# Patient Record
Sex: Male | Born: 1965 | Race: White | Hispanic: No | Marital: Married | Smoking: Never smoker
Health system: Southern US, Community
[De-identification: ages and names within clinical notes are randomized; demographics above are authoritative.]

## PROBLEM LIST (undated history)

## (undated) DIAGNOSIS — I10 Essential (primary) hypertension: Secondary | ICD-10-CM

## (undated) DIAGNOSIS — G4733 Obstructive sleep apnea (adult) (pediatric): Secondary | ICD-10-CM

## (undated) HISTORY — PX: CYST EXCISION PERINEAL: SHX6278

---

## 2002-03-24 ENCOUNTER — Ambulatory Visit (HOSPITAL_COMMUNITY): Admission: RE | Admit: 2002-03-24 | Discharge: 2002-03-24 | Payer: Self-pay | Admitting: General Surgery

## 2007-03-20 ENCOUNTER — Ambulatory Visit (HOSPITAL_COMMUNITY): Admission: RE | Admit: 2007-03-20 | Discharge: 2007-03-20 | Payer: Self-pay | Admitting: *Deleted

## 2007-03-27 ENCOUNTER — Ambulatory Visit (HOSPITAL_COMMUNITY): Admission: RE | Admit: 2007-03-27 | Discharge: 2007-03-27 | Payer: Self-pay | Admitting: *Deleted

## 2011-02-26 NOTE — Cardiovascular Report (Signed)
NAMELEXTON, Michael Hester              ACCOUNT NO.:  [id number]   MEDICAL RECORD NO.:  [id number]          PATIENT TYPE:  OIB   LOCATION:  2852                         FACILITY:  [facility]   PHYSICIAN:  Darlin Priestly, MD  DATE OF BIRTH:  [birth year]   DATE OF PROCEDURE:  03/27/2007  DATE OF DISCHARGE:                            CARDIAC CATHETERIZATION   PROCEDURE:  Heads-up tilt table testing with Isopro infusion.   ATTENDING:  Dr. Lenise Herald.   COMPLICATIONS:  None.   INDICATIONS:  Michael Hester is a 45 year old male patient of Dr. Ilene Qua, Dr. Karleen Hampshire, with a history of hypertension, history  of recurrent syncopal episodes.  He has undergone negative workup today,  including a head CT, echo and Cardiolite scan.  He is now referred for a  heads-up tilt table testing to rule out a possible neurocardiogenic  etiology.   DESCRIPTION OF PROCEDURE:  After being informed of consent, the patient  brought to the cardiac cath lab in fasting state.  He was then placed in  a supine position, and hemodynamic measurements were obtained.  Resting  heart rate 76, the resting blood pressure 134/82.  He was monitored for  approximately 5 minutes.  He was then told to do a 70 degree heads up  position where he was maintained for 30 minutes with no significant  change in heart rate or blood pressure.  He remained asymptomatic.  He  was returned in a supine position.  _______ infusion was begun at 7.5  mm/hour.  Ultimately, I titrated at 30 mm/hour for heart response.  He  was then tilted back to a 70 degree heads up position, where he was  maintained for 10 minutes.  Again, he had no change in his heart rate or  blood pressure.  Remained asymptomatic.  Isopro was then discontinued.  He was returned to a supine position.  He was then transferred to the  recovery room in stable condition.   CONCLUSION:  Negative heads-up tilt table testing with _______ infusion.      Darlin Priestly, MD  Electronically Signed     RHM/MEDQ  D:  03/27/2007  T:  03/27/2007  Job:  [id number]   cc:   Kirk Ruths, M.D.

## 2011-03-01 NOTE — Op Note (Signed)
[facility]  Patient:    Michael Hester, Michael Hester Visit Number: [id number] MRN: [id number]          Service Type: DSU Location: DAY Attending Physician:  Dalia Heading Dictated by:   Franky Macho, M.D. Proc. Date: 03/24/02 Admit Date:  03/24/2002   CC:         Patrica Duel, M.D.   Operative Report  AGE:  45 years old.  PREOPERATIVE DIAGNOSIS:  Pilonidal cyst.  POSTOPERATIVE DIAGNOSIS:  Pilonidal cyst.  PROCEDURE:  Excision of pilonidal cyst.  SURGEON:  Franky Macho, M.D.  ANESTHESIA:  General endotracheal.  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  The risks and benefits of the procedure including bleeding, infection, and recurrence of the pilonidal cyst were fully explained to the patient, who gave informed consent.  DESCRIPTION OF PROCEDURE:  The patient was placed in the right lateral decubitus position after induction of general endotracheal anesthesia.  The pilonidal area was prepped and draped using the usual sterile technique with Betadine.  Confirmation of the intended surgery was confirmed by all present in the operating room.  An elliptical incision was made over the coccyx region, extended approximately 2 to 3 cm.  Hair and granulomatous tissue were noted in the subcutaneous tissue; this was debrided using curettes.  Any bleeding was controlled using Bovie electrocautery.  The wound was irrigated with normal saline.  The skin was reapproximated using 4-0 nylon vertical mattress sutures.  Betadine ointment and dry sterile dressing were applied.  All tape and needle counts were correct at the end of the procedure.  The patient was extubated in the operating room and went back to recovery room awake, in stable condition.  COMPLICATIONS:  None.  SPECIMEN:  Pilonidal cyst.  BLOOD LOSS:  Minimal. Dictated by:   Franky Macho, M.D. Attending  Physician:  Dalia Heading DD:  03/24/02 TD:  03/25/02 Job: [id number]

## 2011-03-10 ENCOUNTER — Emergency Department (HOSPITAL_COMMUNITY)
Admission: EM | Admit: 2011-03-10 | Discharge: 2011-03-10 | Disposition: A | Discharge status: Home / Self Care | Payer: 59 | Attending: Emergency Medicine | Admitting: Emergency Medicine

## 2011-03-10 DIAGNOSIS — W260XXA Contact with knife, initial encounter: Secondary | ICD-10-CM | POA: Insufficient documentation

## 2011-03-10 DIAGNOSIS — W261XXA Contact with sword or dagger, initial encounter: Secondary | ICD-10-CM | POA: Insufficient documentation

## 2011-03-10 DIAGNOSIS — Y998 Other external cause status: Secondary | ICD-10-CM | POA: Insufficient documentation

## 2011-03-10 DIAGNOSIS — S61209A Unspecified open wound of unspecified finger without damage to nail, initial encounter: Secondary | ICD-10-CM | POA: Insufficient documentation

## 2013-07-14 ENCOUNTER — Ambulatory Visit (INDEPENDENT_AMBULATORY_CARE_PROVIDER_SITE_OTHER): Payer: BC Managed Care – PPO | Admitting: Orthopedic Surgery

## 2013-07-14 ENCOUNTER — Encounter: Payer: Self-pay | Admitting: Orthopedic Surgery

## 2013-07-14 ENCOUNTER — Ambulatory Visit (INDEPENDENT_AMBULATORY_CARE_PROVIDER_SITE_OTHER): Payer: BC Managed Care – PPO

## 2013-07-14 VITALS — BP 152/101 | Ht 65.0 in | Wt 220.0 lb

## 2013-07-14 DIAGNOSIS — M7500 Adhesive capsulitis of unspecified shoulder: Secondary | ICD-10-CM

## 2013-07-14 DIAGNOSIS — M25519 Pain in unspecified shoulder: Secondary | ICD-10-CM

## 2013-07-14 DIAGNOSIS — M25511 Pain in right shoulder: Secondary | ICD-10-CM

## 2013-07-14 DIAGNOSIS — M7501 Adhesive capsulitis of right shoulder: Secondary | ICD-10-CM

## 2013-07-14 MED ORDER — HYDROCODONE-ACETAMINOPHEN 5-325 MG PO TABS: 1.0000 | ORAL_TABLET | Freq: Four times a day (QID) | ORAL | Status: DC | PRN

## 2013-07-14 NOTE — Patient Instructions (Addendum)
Therapy   Continue advil and add norco for excess pain   You have received a steroid shot. 15% of patients experience increased pain at the injection site with in the next 24 hours. This is best treated with ice and tylenol extra strength 2 tabs every 8 hours. If you are still having pain please call the office.   Adhesive Capsulitis Sometimes the shoulder becomes stiff and is painful to move. Some people say it feels as if the shoulder is frozen in place. Because of this, the condition is called "frozen shoulder." Its medical name is adhesive capsulitis.  The shoulder joint is made up of strong connective tissue that attaches the ball of the humerus to the shallow shoulder socket. This strong connective tissue is called the joint capsule. This tissue can become stiff and swollen. That is when adhesive capsulitis sets in. CAUSES  It is not always clear just what the cause adhesive capsulitis. Possibilities include:  Injury to the shoulder joint.  Strain. This is a repetitive injury brought about by overuse.  Lack of use. Perhaps your arm or hand was otherwise injured. It might have been in a sling for awhile. Or perhaps you were not using it to avoid pain.  Referred pain. This is a sort of trick the body plays. You feel pain in the shoulder. But, the pain actually comes from an injury somewhere else in the body.  Long-standing health problems. Several diseases can cause adhesive capsulitis. They include diabetes, heart disease, stroke, thyroid problems, rheumatoid arthritis and lung disease.  Being a women older than 40. Anyone can develop adhesive capsulitis but it is most common in women in this age group. SYMPTOMS   Pain.  It occurs when the arm is moved.  Parts of the shoulder might hurt if they are touched.  Pain is worse at night or when resting.  Soreness. It might not be strong enough to be called pain. But, the shoulder aches.  The shoulder does not move freely.  Muscle  spasms.  Trouble sleeping because of shoulder ache or pain. DIAGNOSIS  To decide if you have adhesive capsulitis, your healthcare provider will probably:  Ask about symptoms you have noticed.  Ask about your history of joint pain and anything that might have caused the pain.  Ask about your overall health.  Use hands to feel your shoulder and neck.  Ask you to move your shoulder in specific directions. This may indicate the origin of the pain.  Order imaging tests; pictures of the shoulder. They help pinpoint the source of the problem. An X-ray might be used. For more detail, an MRI is often used. An MRI details the tendons, muscles and ligaments as well as the joint. TREATMENT  Adhesive capsulitis can be treated several ways. Most treatments can be done in a clinic or in your healthcare provider's office. Be sure to discuss the different options with your caregiver. They include:  Physical therapy. You will work on specific exercises to get your shoulder moving again. The exercises usually involve stretching. A physical therapist (a caregiver with special training) can show you what to do and what not to do. The exercises will need to be done daily.  Medication.  Over-the-counter medicines may relieve pain and inflammation (the body's way of reacting to injury or infection).  Corticosteroids. These are stronger drugs to reduce pain and inflammation. They are given by injection (shots) into the shoulder joint. Frequent treatment is not recommended.  Muscle relaxants. Medication may  be prescribed to ease muscle spasms.  Treatment of underlying conditions. This means treating another condition that is causing your shoulder problem. This might be a rotator cuff (tendon) problem  Shoulder manipulation. The shoulder will be moved by your healthcare provider. You would be under general anesthesia (given a drug that puts you to sleep). You would not feel anything. Sometimes the joint will be  injected with salt water (saline) at high pressure to break down internal scarring in the joint capsule.  Surgery. This is rarely needed. It may be suggested in advanced cases after all other treatment has failed. PROGNOSIS  In time, most people recover from adhesive capsulitis. Sometimes, however, the pain goes away but full movement of the shoulder does not return.  HOME CARE INSTRUCTIONS   Take any pain medications recommended by your healthcare provider. Follow the directions carefully.  If you have physical therapy, follow through with the therapist's suggestions. Be sure you understand the exercises you will be doing. You should understand:  How often the exercises should be done.  How many times each exercise should be repeated.  How long they should be done.  What other activities you should do, or not do.  That you should warm up before doing any exercise. Just 5 to 10 minutes will help. Small, gentle movements should get your shoulder ready for more.  Avoid high-demand exercise that involves your shoulder such as throwing. This type of exercise can make pain worse.  Consider using cold packs. Cold may ease swelling and pain. Ask your healthcare provider if a cold pack might help you. If so, get directions on how and when to use them. SEEK MEDICAL CARE IF:   You have any questions about your medications.  Your pain continues to increase. Document Released: 07/28/2009 Document Revised: 12/23/2011 Document Reviewed: 07/28/2009 [facility] Patient Information 2014 [place], [place].

## 2013-07-14 NOTE — Progress Notes (Signed)
Patient ID: Michael Hester, male   DOB: 08-18-1966, 47 y.o.   MRN: [id number] Chief Complaint  Patient presents with  . Shoulder Pain    Right shoulder pain, locks up , can not raise arm., no injury. Consult from Dr. Ladona Ridgel    HISTORY:  47 yo male  presents with atraumatic onset suddenly right shoulder pain stiffness and inability to rotate or elevate his arm With symptoms present for 6 weeks no trauma. Saw chiropractor try manipulation no improvement  . He's on anti-inflammatories but continues to have sharp dull 8/10 intermittent pain which is improved with lying flat on his back. Review of systems negative. 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 Reatha Harps.  Exam BP 152/101  Ht 5\' 5"  (1.651 m)  Wt 220 lb (99.791 kg)  BMI 36.61 kg/m2 His weight is slightly above normal with mild obesity is oriented x3 his appearance is normal mood is normal his gait is normal aircraft he has stiffness and tenderness in the right shoulder with external rotation 5 forward elevation 90. No shoulder instability. Internal and external rotation strength normal. Skin normal. Pulses good sensation normal axillary lymph nodes negative  X-ray negative  He has adhesive capsulitis of the right shoulder  Recommend subacromial injection and physical therapy  Shoulder Injection Procedure Note   Pre-operative Diagnosis: right  RC Syndrome  Post-operative Diagnosis: same  Indications: pain   Anesthesia: ethyl chloride   Procedure Details   Verbal consent was obtained for the procedure. The shoulder was prepped withalcohol and the skin was anesthetized. A 20 gauge needle was advanced into the subacromial space through posterior approach without difficulty  The space was then injected with 3 ml 1%  lidocaine and 1 ml of depomedrol. The injection site was cleansed with isopropyl alcohol and a dressing was applied.  Complications:  None; patient tolerated the procedure well.  Encounter Diagnoses  Name Primary?  . Right shoulder pain   . Capsulitis, adhesive shoulder, right Yes   Meds ordered this encounter  Medications  . HYDROcodone-acetaminophen (NORCO/VICODIN) 5-325 MG per tablet    Sig: Take 1 tablet by mouth every 6 (six) hours as needed for pain.    Dispense:  120 tablet    Refill:  0

## 2013-07-26 ENCOUNTER — Ambulatory Visit (HOSPITAL_COMMUNITY)
Admission: RE | Admit: 2013-07-26 | Discharge: 2013-07-26 | Disposition: A | Discharge status: Home / Self Care | Payer: BC Managed Care – PPO | Source: Ambulatory Visit | Attending: Orthopedic Surgery | Admitting: Orthopedic Surgery

## 2013-07-26 DIAGNOSIS — IMO0001 Reserved for inherently not codable concepts without codable children: Secondary | ICD-10-CM | POA: Insufficient documentation

## 2013-07-26 DIAGNOSIS — M62838 Other muscle spasm: Secondary | ICD-10-CM | POA: Insufficient documentation

## 2013-07-26 DIAGNOSIS — M25511 Pain in right shoulder: Secondary | ICD-10-CM

## 2013-07-26 DIAGNOSIS — M7501 Adhesive capsulitis of right shoulder: Secondary | ICD-10-CM

## 2013-07-26 DIAGNOSIS — M6281 Muscle weakness (generalized): Secondary | ICD-10-CM | POA: Insufficient documentation

## 2013-07-26 DIAGNOSIS — M25519 Pain in unspecified shoulder: Secondary | ICD-10-CM | POA: Insufficient documentation

## 2013-07-26 NOTE — Evaluation (Signed)
Occupational Therapy Evaluation  Patient Details  Name: Michael Hester MRN: [id number] Date of Birth: 19-Oct-1965  Today's Date: 07/26/2013 Time: 1125-1200 OT Time Calculation (min): 35 min OT Evaluation 7829-5621 10' Manual Therapy 1135-1150 15' Visit#: 1 of 16  Re-eval: 08/23/13  Assessment Diagnosis: Right Shoulder Adhesive Capsulitis Next MD Visit: 6 months with Dr. Romeo Apple Prior Therapy: n/a  Authorization: n/a  Authorization Time Period:    Authorization Visit#:   of     Past Medical History: No past medical history on file. Past Surgical History: No past surgical history on file.  Subjective  S:  I want to be able to golf again and use my right arm like I do my left.  Pertinent History: Mr. Nickerson began experiencing pain in his right shoulder with bed mobility and difficulty sleeping at night in April of this year.  By July, he was limiited in his movement.  He consulted with Dr. Romeo Apple and recieved a cortisone injection, which has alleviated his pain and has not improved his mobility.  He has been referred to occupational therapy for evaluation and treatment.   Limitations: progress as tolerated Special Tests: FOTO scored 51/100. Patient Stated Goals: "I want to be able to golf and use my right arm like I do my left." Pain Assessment Currently in Pain?: Yes Pain Score: 3  Pain Location: Shoulder Pain Orientation: Right Pain Type: Acute pain  Precautions/Restrictions  Precautions Precautions: None Restrictions Weight Bearing Restrictions: No  Balance Screening Balance Screen Has the patient fallen in the past 6 months: No Has the patient had a decrease in activity level because of a fear of falling? : No Is the patient reluctant to leave their home because of a fear of falling? : No  Prior Functioning  Prior Function Driving: Yes Vocation: Full time employment Vocation Requirements: Charity fundraiser, works at a low table, reaches overhead frequently Comments:  enjoys golfing and is active with the boyscouts.  He lives with his wife  Assessment ADL/Vision/Perception ADL ADL Comments: difficulty reaching overhead, laying on his right side, getting in and out of bed, is not able to golf or lift heavy items Dominant Hand: Right Vision - History Baseline Vision: Wears glasses all the time  Cognition/Observation Cognition Overall Cognitive Status: Within Functional Limits for tasks assessed  Sensation/Coordination/Edema Sensation Light Touch: Appears Intact Coordination Gross Motor Movements are Fluid and Coordinated: Yes Fine Motor Movements are Fluid and Coordinated: Yes  Additional Assessments RUE AROM (degrees) RUE Overall AROM Comments: assessed in seated, ER/IR with shoulder adducted Right Shoulder Flexion: 90 Degrees Right Shoulder ABduction: 50 Degrees Right Shoulder Internal Rotation: 70 Degrees Right Shoulder External Rotation: 64 Degrees RUE PROM (degrees) Right Shoulder Flexion: 100 Degrees Right Shoulder ABduction: 65 Degrees Right Shoulder Internal Rotation: 70 Degrees Right Shoulder External Rotation: 64 Degrees RUE Strength Right Shoulder Flexion: 3+/5 Right Shoulder ABduction: 3+/5 Right Shoulder Internal Rotation: 3+/5 Right Shoulder External Rotation: 3+/5 LUE AROM (degrees) LUE Overall AROM Comments: assessed in seated, PROM and strength is [facility] Left Shoulder Flexion: 140 Degrees Left Shoulder ABduction: 140 Degrees Left Shoulder Internal Rotation: 90 Degrees Left Shoulder External Rotation: 74 Degrees Palpation Palpation: max fascial restrictions noted in his right upper arm, shoulder, and scapular region to decrease pain and fascial restrictions     Exercise/Treatments  Manual Therapy Manual Therapy: Myofascial release Myofascial Release: MFR and manual stretching to decrease pain and fascial restrictions and improve pain free mobility in right scapular, shoulder, trapezius, and upper arm region.  Occupational Therapy Assessment and Plan OT Assessment and Plan Clinical Impression Statement: A:  Patient presents with decreased A/PROM and strength due to increased pain and fascial restrictions in his right shoulder, causing decreased independence with all B/IADLs, work, and leisure activities.   Pt will benefit from skilled therapeutic intervention in order to improve on the following deficits: Decreased strength;Increased fascial restricitons;Increased muscle spasms;Decreased range of motion;Pain Rehab Potential: Good OT Frequency: Min 2X/week OT Duration: 8 weeks OT Treatment/Interventions: Self-care/ADL training;Therapeutic exercise;Manual therapy;Modalities;Patient/family education;Therapeutic activities OT Plan: P:  Skilled OT intervention to decrease pain and fascial restrictions and improve pain free mobility and strength needed to return to prior level of independence with all B/IADLS, work, and leisure activities.  Treatment Plan:  MFR and manual stretching in supine.  Supine PROM, AAROM, isometric strengthening.  seated elev, ext, row, ball stretches, thumbtacks, prot/ret//elev/dep.     Goals Short Term Goals Time to Complete Short Term Goals: 4 weeks Short Term Goal 1: Patient will be independent with HEP. Short Term Goal 2: Patient will improve PROM in his right shoulder to [facility] for increased ability to reach shelving at work.  Short Term Goal 3: Patient will increase right shoulder strength to 4/5 for increased abilty to lift items overhead at work.  Short Term Goal 4: Patient will decrease pain in his right shoulder to 2/10 when rolling over in bed. Short Term Goal 5: Patient will decrease fascial restrictions to min-mod in his right shoulder region. Long Term Goals Time to Complete Long Term Goals: 8 weeks Long Term Goal 1: Patient will return to prior level of independence with all B/IADLS, work, and leisure activities, using his right arm as dominant.  Long Term Goal 2:  Patient will improve AROM in his right shoulder to [facility] for increased ability to reach shelving at work.  Long Term Goal 3: Patient will increase right shoulder strength to 5/5 for increased abilty to lift items overhead at work.  Long Term Goal 4: Patient will decrease pain in his right shoulder to 1/10 when rolling over in bed. Long Term Goal 5: Patient will decrease fascial restrictions to minimal in his right shoulder region.  Problem List Patient Active Problem List   Diagnosis Date Noted  . Pain in joint, shoulder region 07/26/2013  . Muscle weakness (generalized) 07/26/2013  . Capsulitis, adhesive shoulder 07/14/2013    End of Session Activity Tolerance: Patient tolerated treatment well General Behavior During Therapy: [facility] for tasks assessed/performed OT Plan of Care OT Home Exercise Plan: towel slides - educated on and paper given. Consulted and Agree with Plan of Care: Patient  GO    Shirlean Mylar, OTR/L  07/26/2013, 12:22 PM  Physician Documentation Your signature is required to indicate approval of the treatment plan as stated above.  Please sign and either send electronically or make a copy of this report for your files and return this physician signed original.  Please mark one 1.__approve of plan  2. ___approve of plan with the following conditions.   ______________________________                                                          _____________________ Physician Signature  Date  

## 2013-07-28 ENCOUNTER — Ambulatory Visit (HOSPITAL_COMMUNITY)
Admission: RE | Admit: 2013-07-28 | Discharge: 2013-07-28 | Disposition: A | Discharge status: Home / Self Care | Payer: BC Managed Care – PPO | Source: Ambulatory Visit | Attending: Orthopedic Surgery | Admitting: Orthopedic Surgery

## 2013-07-28 DIAGNOSIS — M6281 Muscle weakness (generalized): Secondary | ICD-10-CM

## 2013-07-28 DIAGNOSIS — M25511 Pain in right shoulder: Secondary | ICD-10-CM

## 2013-07-28 NOTE — Progress Notes (Signed)
Occupational Therapy Treatment Patient Details  Name: Michael Hester MRN: [id number] Date of Birth: [birth year]  Today's Date: 07/28/2013 Time: 1020-1110 OT Time Calculation (min): 50 min Manual therapy 1020-1045 25' Therapeutic exercises 1045-1110 25' Visit#: 2 of 16  Re-eval: 08/23/13    Authorization: n/a  Authorization Time Period:    Authorization Visit#:   of    Subjective  S:  I was able to move my arm through the full range on Tuesday and today its flared up and inflamed again.  Pain Assessment Currently in Pain?: Yes Pain Score: 2  Pain Location: Shoulder Pain Orientation: Right Pain Type: Acute pain  Precautions/Restrictions     Exercise/Treatments Supine Protraction: PROM;AAROM;10 reps Horizontal ABduction: PROM;AAROM;10 reps External Rotation: PROM;AAROM;10 reps Internal Rotation: PROM;AAROM;10 reps Flexion: PROM;AAROM;10 reps ABduction: PROM;AAROM;10 reps Therapy Ball Flexion: 25 reps ABduction: 25 reps Right/Left: 5 reps      Manual Therapy Manual Therapy: Myofascial release Myofascial Release: MFR and manual stretching to decrease pain and fascial restrictions and improve pain free mobility in right scapular, shoulder, trapezius, and upper arm region.  Occupational Therapy Assessment and Plan OT Assessment and Plan Clinical Impression Statement: A:  Patient quite guarded with PROM/manual stretching due to elevated pain at end range of stretch.  Patient able to self stretch further with dowel rod, therefore added AAROM exercises during treatment session and added for HEP.  OT Plan: P:  Add pulleys and wall wash for gaining AAROM.   Goals Short Term Goals Time to Complete Short Term Goals: 4 weeks Short Term Goal 1: Patient will be independent with HEP. Short Term Goal 2: Patient will improve PROM in his right shoulder to [facility] for increased ability to reach shelving at work.  Short Term Goal 3: Patient will increase right shoulder strength to 4/5  for increased abilty to lift items overhead at work.  Short Term Goal 4: Patient will decrease pain in his right shoulder to 2/10 when rolling over in bed. Short Term Goal 5: Patient will decrease fascial restrictions to min-mod in his right shoulder region. Long Term Goals Time to Complete Long Term Goals: 8 weeks Long Term Goal 1: Patient will return to prior level of independence with all B/IADLS, work, and leisure activities, using his right arm as dominant.  Long Term Goal 2: Patient will improve AROM in his right shoulder to [facility] for increased ability to reach shelving at work.  Long Term Goal 3: Patient will increase right shoulder strength to 5/5 for increased abilty to lift items overhead at work.  Long Term Goal 4: Patient will decrease pain in his right shoulder to 1/10 when rolling over in bed. Long Term Goal 5: Patient will decrease fascial restrictions to minimal in his right shoulder region.  Problem List Patient Active Problem List   Diagnosis Date Noted  . Pain in joint, shoulder region 07/26/2013  . Muscle weakness (generalized) 07/26/2013  . Capsulitis, adhesive shoulder 07/14/2013    End of Session Activity Tolerance: Patient tolerated treatment well General Behavior During Therapy: WFL for tasks assessed/performed OT Plan of Care OT Home Exercise Plan: added dowel rod in supine for flexion, abduction, external rotation, and horizontal abduction 10X each 2 X per day  GO    Shirlean Mylar, OTR/L  07/28/2013, 11:04 AM

## 2013-08-03 ENCOUNTER — Ambulatory Visit (HOSPITAL_COMMUNITY): Payer: BC Managed Care – PPO | Admitting: Occupational Therapy

## 2013-08-05 ENCOUNTER — Ambulatory Visit (HOSPITAL_COMMUNITY)
Admission: RE | Admit: 2013-08-05 | Discharge: 2013-08-05 | Disposition: A | Discharge status: Home / Self Care | Payer: BC Managed Care – PPO | Source: Ambulatory Visit | Attending: Occupational Therapy | Admitting: Occupational Therapy

## 2013-08-05 NOTE — Progress Notes (Signed)
Occupational Therapy Treatment Patient Details  Name: Michael Hester MRN: [id number] Date of Birth: 12-Dec-1965  Today's Date: 08/05/2013 Time: 4098-1191 OT Time Calculation (min): 47 min Manual 1521-1541 (20') TherExercise 4782-9562 (27')  Visit#: 3 of 16  Re-eval: 08/23/13     Subjective Symptoms/Limitations Symptoms: S: I have a ball at home and I have been doing the exercises that she showed me the other day  Pain Assessment Currently in Pain?: Yes Pain Score: 2  Pain Location: Shoulder Pain Orientation: Right Pain Type: Acute pain Multiple Pain Sites: No      Exercise/Treatments Supine Protraction: PROM;AAROM;10 reps Horizontal ABduction: PROM;AAROM;10 reps External Rotation: PROM;AAROM;10 reps Internal Rotation: PROM;AAROM;10 reps Flexion: PROM;AAROM;10 reps ABduction: PROM;AAROM;10 reps Seated Elevation: AROM;10 reps Extension: AROM;10 reps Row: AROM;10 reps Pulleys Flexion: 1 minute ABduction: 1 minute Therapy Ball Flexion: 25 reps ABduction: 25 reps Right/Left: 5 reps         Occupational Therapy Assessment and Plan OT Assessment and Plan Clinical Impression Statement: A: Patient cont with guarded posturing with PROM/manual stretching.  Added pulleys this date wtih fair tolerance/success.  Patient with increased compensatory trunk rotation to achieve range.  Educated on decreasing compensation and improving body awareness.  OT Plan: P: Add wall wash for gaining AAROM.     Goals Short Term Goals Short Term Goal 1: Patient will be independent with HEP. Short Term Goal 1 Progress: Progressing toward goal Short Term Goal 2: Patient will improve PROM in his right shoulder to [facility] for increased ability to reach shelving at work.  Short Term Goal 2 Progress: Progressing toward goal Short Term Goal 3: Patient will increase right shoulder strength to 4/5 for increased abilty to lift items overhead at work.  Short Term Goal 3 Progress: Progressing  toward goal Short Term Goal 4: Patient will decrease pain in his right shoulder to 2/10 when rolling over in bed. Short Term Goal 4 Progress: Progressing toward goal Short Term Goal 5: Patient will decrease fascial restrictions to min-mod in his right shoulder region. Short Term Goal 5 Progress: Progressing toward goal Long Term Goals Long Term Goal 1: Patient will return to prior level of independence with all B/IADLS, work, and leisure activities, using his right arm as dominant.  Long Term Goal 1 Progress: Progressing toward goal Long Term Goal 2: Patient will improve AROM in his right shoulder to [facility] for increased ability to reach shelving at work.  Long Term Goal 2 Progress: Progressing toward goal Long Term Goal 3: Patient will increase right shoulder strength to 5/5 for increased abilty to lift items overhead at work.  Long Term Goal 3 Progress: Progressing toward goal Long Term Goal 4: Patient will decrease pain in his right shoulder to 1/10 when rolling over in bed. Long Term Goal 4 Progress: Progressing toward goal Long Term Goal 5: Patient will decrease fascial restrictions to minimal in his right shoulder region. Long Term Goal 5 Progress: Progressing toward goal  Problem List Patient Active Problem List   Diagnosis Date Noted  . Pain in joint, shoulder region 07/26/2013  . Muscle weakness (generalized) 07/26/2013  . Capsulitis, adhesive shoulder 07/14/2013    End of Session Activity Tolerance: Patient tolerated treatment well  GO    Velora Mediate, OTR/L  08/05/2013, 4:38 PM

## 2013-08-10 ENCOUNTER — Ambulatory Visit (HOSPITAL_COMMUNITY)
Admission: RE | Admit: 2013-08-10 | Discharge: 2013-08-10 | Disposition: A | Discharge status: Home / Self Care | Payer: BC Managed Care – PPO | Source: Ambulatory Visit | Attending: Family Medicine | Admitting: Family Medicine

## 2013-08-10 DIAGNOSIS — M25511 Pain in right shoulder: Secondary | ICD-10-CM

## 2013-08-10 DIAGNOSIS — M6281 Muscle weakness (generalized): Secondary | ICD-10-CM

## 2013-08-10 NOTE — Progress Notes (Signed)
Occupational Therapy Treatment Patient Details  Name: Michael Hester MRN: [id number] Date of Birth: 07-19-1966  Today's Date: 08/10/2013 Time: 1100-1149 OT Time Calculation (min): 49 min Manual Therapy 1100-1120 20' Therapeutic exercises 1120-1149 29' Visit#: 4 of 16  Re-eval: 08/23/13    Subjective S:  I think its better, the inflammation leaves quicker and doesnt seem as intense.  Pain Assessment Currently in Pain?: Yes Pain Score: 2  Pain Location: Shoulder Pain Orientation: Right Pain Type: Acute pain  Precautions/Restrictions   progress as tolerated  Exercise/Treatments Supine Protraction: PROM;10 reps;AAROM;12 reps Horizontal ABduction: PROM;10 reps;AAROM;12 reps External Rotation: PROM;10 reps;AAROM;12 reps Internal Rotation: PROM;10 reps;AAROM;12 reps Flexion: PROM;10 reps;AAROM;12 reps ABduction: PROM;10 reps;AAROM;12 reps Standing Extension: Theraband;15 reps Theraband Level (Shoulder Extension): Level 2 (Red) Row: Theraband;15 reps Theraband Level (Shoulder Row): Level 2 (Red) Pulleys Flexion: 2 minutes ABduction: 2 minutes Therapy Ball Flexion: 25 reps ABduction: 25 reps Right/Left: 5 reps ROM / Strengthening / Isometric Strengthening Wall Wash: 2' Proximal Shoulder Strengthening, Supine: 10 times each without resting      Manual Therapy Manual Therapy: Myofascial release Myofascial Release: MFR and manual stretching to decrease pain and fascial restrictions and improve pain free mobility in right scapular, shoulder, trapezius, and upper arm region.  Occupational Therapy Assessment and Plan OT Assessment and Plan Clinical Impression Statement: A:  Patient with significant improvement in mobility in supine this date.  Patient aware of compensatory movements and self corrected throughout treatment session.  OT Plan: P:  Add thumbtacks and prot/ret//elev/dep for greater shoulder stability   Goals Short Term Goals Short Term Goal 1: Patient will  be independent with HEP. Short Term Goal 1 Progress: Progressing toward goal Short Term Goal 2: Patient will improve PROM in his right shoulder to [facility] for increased ability to reach shelving at work.  Short Term Goal 2 Progress: Progressing toward goal Short Term Goal 3: Patient will increase right shoulder strength to 4/5 for increased abilty to lift items overhead at work.  Short Term Goal 3 Progress: Progressing toward goal Short Term Goal 4: Patient will decrease pain in his right shoulder to 2/10 when rolling over in bed. Short Term Goal 4 Progress: Progressing toward goal Short Term Goal 5: Patient will decrease fascial restrictions to min-mod in his right shoulder region. Short Term Goal 5 Progress: Progressing toward goal Long Term Goals Long Term Goal 1: Patient will return to prior level of independence with all B/IADLS, work, and leisure activities, using his right arm as dominant.  Long Term Goal 1 Progress: Progressing toward goal Long Term Goal 2: Patient will improve AROM in his right shoulder to [facility] for increased ability to reach shelving at work.  Long Term Goal 2 Progress: Progressing toward goal Long Term Goal 3: Patient will increase right shoulder strength to 5/5 for increased abilty to lift items overhead at work.  Long Term Goal 3 Progress: Progressing toward goal Long Term Goal 4: Patient will decrease pain in his right shoulder to 1/10 when rolling over in bed. Long Term Goal 4 Progress: Progressing toward goal Long Term Goal 5: Patient will decrease fascial restrictions to minimal in his right shoulder region. Long Term Goal 5 Progress: Progressing toward goal  Problem List Patient Active Problem List   Diagnosis Date Noted  . Pain in joint, shoulder region 07/26/2013  . Muscle weakness (generalized) 07/26/2013  . Capsulitis, adhesive shoulder 07/14/2013    End of Session Activity Tolerance: Patient tolerated treatment well General Behavior During Therapy:  [facility]  for tasks assessed/performed  GO    Shirlean Mylar, OTR/L  08/10/2013, 11:51 AM

## 2013-08-13 ENCOUNTER — Ambulatory Visit (HOSPITAL_COMMUNITY)
Admission: RE | Admit: 2013-08-13 | Discharge: 2013-08-13 | Disposition: A | Discharge status: Home / Self Care | Payer: BC Managed Care – PPO | Source: Ambulatory Visit | Attending: Family Medicine | Admitting: Family Medicine

## 2013-08-13 DIAGNOSIS — M6281 Muscle weakness (generalized): Secondary | ICD-10-CM

## 2013-08-13 DIAGNOSIS — M25511 Pain in right shoulder: Secondary | ICD-10-CM

## 2013-08-13 NOTE — Progress Notes (Signed)
Occupational Therapy Treatment Patient Details  Name: KEPLER MCCABE MRN: [id number] Date of Birth: May 16, 1966  Today's Date: 08/13/2013 Time: 4098-1191 OT Time Calculation (min): 42 min Manual Therapy [id number] 28' Therapeutic exercises [phone]' Visit#: 5 of 16  Re-eval: 08/23/13    Authorization: n/a  Authorization Time Period:    Authorization Visit#:   of    Subjective S:  I can reach the rearview mirror of my Michael Hester now.  Limitations: progress as tolerated Pain Assessment Currently in Pain?: Yes Pain Score: 1  Pain Location: Shoulder Pain Orientation: Right Pain Type: Acute pain  Precautions/Restrictions   progress as tolerated  Exercise/Treatments Supine Protraction: PROM;10 reps;AROM;AAROM;5 reps Horizontal ABduction: PROM;10 reps;AROM;AAROM;5 reps External Rotation: PROM;10 reps;AROM;AAROM;5 reps Internal Rotation: PROM;10 reps;AROM;AAROM Flexion: PROM;10 reps;AAROM;AROM;5 reps ABduction: AROM;AAROM;5 reps;PROM;10 reps Seated External Rotation: AROM;5 reps Internal Rotation: AROM;5 reps Standing External Rotation: Theraband;15 reps Theraband Level (Shoulder External Rotation): Level 2 (Red) Internal Rotation: Theraband;15 reps Theraband Level (Shoulder Internal Rotation): Level 2 (Red) Extension: Theraband;15 reps Theraband Level (Shoulder Extension): Level 2 (Red) Row: Theraband;15 reps Theraband Level (Shoulder Row): Level 2 (Red) Pulleys Flexion: 2 minutes ABduction: 2 minutes ROM / Strengthening / Isometric Strengthening Wall Wash: resume next visit Proximal Shoulder Strengthening, Supine: resume next visit         Manual Therapy Manual Therapy: Myofascial release Myofascial Release: MFR and manual stretching to decrease pain and fascial restrictions and improve pain free mobility in right scapular, shoulder, trapezius, and upper arm region.  Occupational Therapy Assessment and Plan OT Assessment and Plan Clinical Impression Statement: A:   Added AROM in addition to AAROM in supine.  Transitioned to ER with shoulder abducted to 90 for ER stretching with great results. OT Plan: P:  Add thumbtacks and prot/ret//elev/dep, add seated dowel rod exercises.   Goals Short Term Goals Short Term Goal 1: Patient will be independent with HEP. Short Term Goal 1 Progress: Progressing toward goal Short Term Goal 2: Patient will improve PROM in his right shoulder to [facility] for increased ability to reach shelving at work.  Short Term Goal 2 Progress: Progressing toward goal Short Term Goal 3: Patient will increase right shoulder strength to 4/5 for increased abilty to lift items overhead at work.  Short Term Goal 3 Progress: Progressing toward goal Short Term Goal 4: Patient will decrease pain in his right shoulder to 2/10 when rolling over in bed. Short Term Goal 4 Progress: Progressing toward goal Short Term Goal 5: Patient will decrease fascial restrictions to min-mod in his right shoulder region. Short Term Goal 5 Progress: Progressing toward goal Long Term Goals Long Term Goal 1: Patient will return to prior level of independence with all B/IADLS, work, and leisure activities, using his right arm as dominant.  Long Term Goal 1 Progress: Progressing toward goal Long Term Goal 2: Patient will improve AROM in his right shoulder to [facility] for increased ability to reach shelving at work.  Long Term Goal 2 Progress: Progressing toward goal Long Term Goal 3: Patient will increase right shoulder strength to 5/5 for increased abilty to lift items overhead at work.  Long Term Goal 3 Progress: Progressing toward goal Long Term Goal 4: Patient will decrease pain in his right shoulder to 1/10 when rolling over in bed. Long Term Goal 4 Progress: Progressing toward goal Long Term Goal 5: Patient will decrease fascial restrictions to minimal in his right shoulder region. Long Term Goal 5 Progress: Progressing toward goal  Problem List Patient Active Problem  List   Diagnosis Date Noted  . Pain in joint, shoulder region 07/26/2013  . Muscle weakness (generalized) 07/26/2013  . Capsulitis, adhesive shoulder 07/14/2013    End of Session Activity Tolerance: Patient tolerated treatment well General Behavior During Therapy: [facility] for tasks assessed/performed OT Plan of Care OT Home Exercise Plan: pulley   Shirlean Mylar, OTR/L  08/13/2013, 1:11 PM

## 2013-08-17 ENCOUNTER — Ambulatory Visit (HOSPITAL_COMMUNITY)
Admission: RE | Admit: 2013-08-17 | Discharge: 2013-08-17 | Disposition: A | Discharge status: Home / Self Care | Payer: BC Managed Care – PPO | Source: Ambulatory Visit | Attending: Family Medicine | Admitting: Family Medicine

## 2013-08-17 DIAGNOSIS — IMO0001 Reserved for inherently not codable concepts without codable children: Secondary | ICD-10-CM | POA: Insufficient documentation

## 2013-08-17 DIAGNOSIS — M62838 Other muscle spasm: Secondary | ICD-10-CM | POA: Insufficient documentation

## 2013-08-17 DIAGNOSIS — M6281 Muscle weakness (generalized): Secondary | ICD-10-CM | POA: Insufficient documentation

## 2013-08-17 DIAGNOSIS — M25519 Pain in unspecified shoulder: Secondary | ICD-10-CM | POA: Insufficient documentation

## 2013-08-17 NOTE — Progress Notes (Signed)
Occupational Therapy Treatment Patient Details  Name: Michael Hester MRN: [id number] Date of Birth: 09-11-1966  Today's Date: 08/17/2013 Time: 4098-1191 OT Time Calculation (min): 47 min Manual 1350-1410 )20') TherExercises 1410-1437 (27')   Visit#: 6 of 16  Re-eval:      Authorization: n/a  Authorization Time Period:    Authorization Visit#:   of    Subjective Symptoms/Limitations Symptoms: S:  I really feel like we did too much last time... really overdid it trying to get that last little bit on the pulleys Pain Assessment Currently in Pain?: Yes Pain Score: 5  Pain Location: Shoulder Pain Orientation: Right Pain Type: Acute pain Multiple Pain Sites: No  Precautions/Restrictions     Exercise/Treatments Supine Protraction: PROM;10 reps;AROM;AAROM;5 reps Horizontal ABduction: PROM;10 reps;AROM;AAROM;5 reps External Rotation: PROM;10 reps;AROM;AAROM;5 reps Internal Rotation: PROM;10 reps;AROM;AAROM Flexion: PROM;10 reps;AAROM;AROM;5 reps ABduction: AROM;AAROM;5 reps;PROM;10 reps Seated External Rotation: AROM;10 reps Internal Rotation: AROM;10 reps Standing External Rotation: Theraband;15 reps Theraband Level (Shoulder External Rotation): Level 2 (Red) Internal Rotation: Theraband;15 reps Theraband Level (Shoulder Internal Rotation): Level 2 (Red) Extension: Theraband;15 reps Theraband Level (Shoulder Extension): Level 2 (Red) Row: Theraband;15 reps Theraband Level (Shoulder Row): Level 2 (Red) Pulleys Flexion: 2 minutes ABduction: 2 minutes ROM / Strengthening / Isometric Strengthening UBE (Upper Arm Bike): 2' (handles on 4 and increased height on base for increased ROM) Wall Wash: 2' Proximal Shoulder Strengthening, Supine: resume next visit    Manual Therapy Manual Therapy: Myofascial release Myofascial Release: MFR and manual stretching to decrease pain and fascial restrictions and improve pain free mobility in right scapular, shoulder, trapezius,  and upper arm region  Occupational Therapy Assessment and Plan OT Assessment and Plan Clinical Impression Statement: A:  Patient with increased complaint of tinghtness  this date and report of overdoing things. Initiated UBE this date with body of machine elevated to increase range of motion with good tolerance  OT Plan: P:  Add thumbtacks and prot/ret//elev/dep, add seated dowel rod exercises.   Goals Short Term Goals Short Term Goal 1: Patient will be independent with HEP. Short Term Goal 1 Progress: Progressing toward goal Short Term Goal 2: Patient will improve PROM in his right shoulder to [facility] for increased ability to reach shelving at work.  Short Term Goal 2 Progress: Progressing toward goal Short Term Goal 3: Patient will increase right shoulder strength to 4/5 for increased abilty to lift items overhead at work.  Short Term Goal 3 Progress: Progressing toward goal Short Term Goal 4: Patient will decrease pain in his right shoulder to 2/10 when rolling over in bed. Short Term Goal 4 Progress: Progressing toward goal Short Term Goal 5: Patient will decrease fascial restrictions to min-mod in his right shoulder region. Short Term Goal 5 Progress: Progressing toward goal Long Term Goals Long Term Goal 1: Patient will return to prior level of independence with all B/IADLS, work, and leisure activities, using his right arm as dominant.  Long Term Goal 1 Progress: Progressing toward goal Long Term Goal 2: Patient will improve AROM in his right shoulder to [facility] for increased ability to reach shelving at work.  Long Term Goal 2 Progress: Progressing toward goal Long Term Goal 3: Patient will increase right shoulder strength to 5/5 for increased abilty to lift items overhead at work.  Long Term Goal 3 Progress: Progressing toward goal Long Term Goal 4: Patient will decrease pain in his right shoulder to 1/10 when rolling over in bed. Long Term Goal 4 Progress: Progressing toward goal  Long  Term Goal 5: Patient will decrease fascial restrictions to minimal in his right shoulder region. Long Term Goal 5 Progress: Progressing toward goal  Problem List Patient Active Problem List   Diagnosis Date Noted  . Pain in joint, shoulder region 07/26/2013  . Muscle weakness (generalized) 07/26/2013  . Capsulitis, adhesive shoulder 07/14/2013    End of Session Activity Tolerance: Patient tolerated treatment well General Behavior During Therapy: [facility] for tasks assessed/performed   Velora Mediate, OTR/L  08/17/2013, 4:00 PM

## 2013-08-20 ENCOUNTER — Ambulatory Visit (HOSPITAL_COMMUNITY)
Admission: RE | Admit: 2013-08-20 | Discharge: 2013-08-20 | Disposition: A | Discharge status: Home / Self Care | Payer: BC Managed Care – PPO | Source: Ambulatory Visit | Attending: Family Medicine | Admitting: Family Medicine

## 2013-08-23 NOTE — Progress Notes (Signed)
Occupational Therapy Treatment Patient Details  Name: Michael Hester MRN: [id number] Date of Birth: Apr 20, 1966  Today's Date: 08/20/2013 Time: 1308-6578 OT Time Calculation (min): 47 min Manual 1518-1538 (20') TherExercises 4696-2952 (27')  Visit#: 7 of 16  Re-eval: 08/23/13    Authorization: n/a   Subjective Symptoms/Limitations Symptoms: S: I am a little tight up where my knot is, up in the soft tissue Pain Assessment Currently in Pain?: Yes Pain Score: 3  Pain Location: Shoulder Pain Orientation: Right Pain Type: Acute pain Multiple Pain Sites: No   Exercise/Treatments Supine Protraction: PROM;10 reps;AROM;AAROM;5 reps Horizontal ABduction: PROM;10 reps;AROM;AAROM;5 reps External Rotation: PROM;10 reps;AROM;AAROM;5 reps Internal Rotation: PROM;10 reps;AROM;AAROM Flexion: PROM;10 reps;AAROM;AROM;5 reps ABduction: PROM;10 reps;AROM;AAROM;5 reps Seated External Rotation: AROM;10 reps Internal Rotation: AROM;10 reps Standing Other Standing Exercises: standing towel stretches for IR/ER x 5 reps each with 10-15 second hold  Pulleys Flexion: 2 minutes ABduction: 2 minutes Stretches Corner Stretch: 5 reps;10 seconds Other Shoulder Stretches: over head  flexion with 5# weight in bilateral hands x 1' Other Shoulder Stretches: also instructedon doorway stretches x 5 reps x 10 seconds  Occupational Therapy Assessment and Plan OT Assessment and Plan Clinical Impression Statement: A: Patient with complaint of shoulder continuing to tighten up throughout day.  Educated on, performed and verblaized good understanding of corner/doorway stretches this date.  towel stretches performed with no complaint of increased pain only stiffness and tight shoulder for IR.  OT Plan: P:  Add thumbtacks and prot/ret//elev/dep, add seated dowel rod exercises.   Goals Short Term Goals Short Term Goal 1: Patient will be independent with HEP. Short Term Goal 1 Progress: Progressing toward  goal Short Term Goal 2: Patient will improve PROM in his right shoulder to [facility] for increased ability to reach shelving at work.  Short Term Goal 2 Progress: Progressing toward goal Short Term Goal 3: Patient will increase right shoulder strength to 4/5 for increased abilty to lift items overhead at work.  Short Term Goal 3 Progress: Progressing toward goal Short Term Goal 4: Patient will decrease pain in his right shoulder to 2/10 when rolling over in bed. Short Term Goal 4 Progress: Progressing toward goal Short Term Goal 5: Patient will decrease fascial restrictions to min-mod in his right shoulder region. Short Term Goal 5 Progress: Progressing toward goal Long Term Goals Long Term Goal 1: Patient will return to prior level of independence with all B/IADLS, work, and leisure activities, using his right arm as dominant.  Long Term Goal 1 Progress: Progressing toward goal Long Term Goal 2: Patient will improve AROM in his right shoulder to [facility] for increased ability to reach shelving at work.  Long Term Goal 2 Progress: Progressing toward goal Long Term Goal 3: Patient will increase right shoulder strength to 5/5 for increased abilty to lift items overhead at work.  Long Term Goal 3 Progress: Progressing toward goal Long Term Goal 4: Patient will decrease pain in his right shoulder to 1/10 when rolling over in bed. Long Term Goal 4 Progress: Progressing toward goal Long Term Goal 5: Patient will decrease fascial restrictions to minimal in his right shoulder region. Long Term Goal 5 Progress: Progressing toward goal  Problem List Patient Active Problem List   Diagnosis Date Noted  . Pain in joint, shoulder region 07/26/2013  . Muscle weakness (generalized) 07/26/2013  . Capsulitis, adhesive shoulder 07/14/2013    End of Session Activity Tolerance: Patient tolerated treatment well General Behavior During Therapy: [facility] for tasks assessed/performed   Herbert Seta  Konrad Dolores, OTR/L  08/20/2013,  4:25 PM

## 2013-08-26 ENCOUNTER — Ambulatory Visit (HOSPITAL_COMMUNITY)
Admission: RE | Admit: 2013-08-26 | Discharge: 2013-08-26 | Disposition: A | Discharge status: Home / Self Care | Payer: BC Managed Care – PPO | Source: Ambulatory Visit | Attending: Family Medicine | Admitting: Family Medicine

## 2013-08-26 DIAGNOSIS — M6281 Muscle weakness (generalized): Secondary | ICD-10-CM

## 2013-08-26 DIAGNOSIS — M25511 Pain in right shoulder: Secondary | ICD-10-CM

## 2013-08-26 NOTE — Evaluation (Signed)
Occupational Therapy Re-Evaluation  Patient Details  Name: Michael Hester MRN: [id number] Date of Birth: 1966-03-08  Today's Date: 08/26/2013 Time: 7829-5621 OT Time Calculation (min): 49 min Manual therapy 3086-5784 16' Therapeutic exercises 6962-9528 23' Reassess 4132-4401 10' Visit#: 8 of 16  Re-eval: 09/23/13  Assessment Diagnosis: Right Shoulder Adhesive Capsulitis  Authorization:    Authorization Time Period:    Authorization Visit#:   of     Past Medical History: No past medical history on file. Past Surgical History: No past surgical history on file.  Subjective S:  I can touch the rearview mirror now and stretch out alot easier. Special Tests: FOTO score is 61 and was 51 one month ago Pain Assessment Currently in Pain?: Yes Pain Score: 2  Pain Location: Shoulder Pain Orientation: Right Pain Type: Acute pain    Additional Assessments RUE AROM (degrees) RUE Overall AROM Comments: assessed in seated (initial evaluation), ER/IR with shoulder adducted Right Shoulder Flexion: 116 Degrees (90) Right Shoulder ABduction: 108 Degrees (50) Right Shoulder Internal Rotation: 80 Degrees (70) Right Shoulder External Rotation: 64 Degrees (64) RUE PROM (degrees) Right Shoulder Flexion: 135 Degrees (100) Right Shoulder ABduction: 115 Degrees (65) Right Shoulder Internal Rotation: 80 Degrees (70) Right Shoulder External Rotation: 64 Degrees (64) RUE Strength Right Shoulder Flexion: 4/5 (3+/5) Right Shoulder ABduction: 4/5 (3+/5) Right Shoulder Internal Rotation: 4/5 (3+/5) Right Shoulder External Rotation: 4/5 (3+/5)     Exercise/Treatments Supine Protraction: PROM;10 reps Horizontal ABduction: PROM;10 reps External Rotation: PROM;10 reps Internal Rotation: PROM;10 reps Flexion: PROM;10 reps ABduction: PROM;10 reps Standing External Rotation: Theraband;15 reps Theraband Level (Shoulder External Rotation): Level 2 (Red) Internal Rotation: Theraband;15  reps Theraband Level (Shoulder Internal Rotation): Level 2 (Red) Extension: Theraband;15 reps Theraband Level (Shoulder Extension): Level 2 (Red) Row: Theraband;15 reps Theraband Level (Shoulder Row): Level 2 (Red) Pulleys Flexion: 2 minutes ABduction: 2 minutes ROM / Strengthening / Isometric Strengthening UBE (Upper Arm Bike): 3' forward and 3' in reverse        Manual Therapy Manual Therapy: Myofascial release Myofascial Release: MFR and manual stretching to decrease pain and fascial restrictions and improve pain free mobility in right scapular, shoulder, trapezius, and upper arm region  Occupational Therapy Assessment and Plan OT Assessment and Plan Clinical Impression Statement: A:  Patient is making progress towards his goals.  He has had significant improvement in AROM and PROM, however, continues to have limited ablility to reach forward, and reach behind his head and back.  Patient would benefit  from continued skilled OT intervention to improve AROM and strength and decrease pan and restrictions to [facility].  OT Frequency: Min 2X/week OT Duration: 4 weeks OT Plan: P:  Follow up on HEP, attempt seated dowel rod exercises.   Goals Short Term Goals Short Term Goal 1: Patient will be independent with HEP. Short Term Goal 1 Progress: Met Short Term Goal 2: Patient will improve PROM in his right shoulder to [facility] for increased ability to reach shelving at work.  Short Term Goal 2 Progress: Progressing toward goal Short Term Goal 3: Patient will increase right shoulder strength to 4/5 for increased abilty to lift items overhead at work.  Short Term Goal 3 Progress: Met Short Term Goal 4: Patient will decrease pain in his right shoulder to 2/10 when rolling over in bed. Short Term Goal 4 Progress: Progressing toward goal Short Term Goal 5: Patient will decrease fascial restrictions to min-mod in his right shoulder region. Short Term Goal 5 Progress: Progressing toward goal Long  Term  Goals Long Term Goal 1: Patient will return to prior level of independence with all B/IADLS, work, and leisure activities, using his right arm as dominant.  Long Term Goal 1 Progress: Progressing toward goal Long Term Goal 2: Patient will improve AROM in his right shoulder to [facility] for increased ability to reach shelving at work.  Long Term Goal 2 Progress: Progressing toward goal Long Term Goal 3: Patient will increase right shoulder strength to 5/5 for increased abilty to lift items overhead at work.  Long Term Goal 3 Progress: Progressing toward goal Long Term Goal 4: Patient will decrease pain in his right shoulder to 1/10 when rolling over in bed. Long Term Goal 4 Progress: Progressing toward goal Long Term Goal 5: Patient will decrease fascial restrictions to minimal in his right shoulder region. Long Term Goal 5 Progress: Progressing toward goal  Problem List Patient Active Problem List   Diagnosis Date Noted  . Pain in joint, shoulder region 07/26/2013  . Muscle weakness (generalized) 07/26/2013  . Capsulitis, adhesive shoulder 07/14/2013    End of Session Activity Tolerance: Patient tolerated treatment well General Behavior During Therapy: [facility] for tasks assessed/performed OT Plan of Care OT Home Exercise Plan: star gazer stretch Consulted and Agree with Plan of Care: Patient  GO    Shirlean Mylar, OTR/L  08/26/2013, 4:38 PM  Physician Documentation Your signature is required to indicate approval of the treatment plan as stated above.  Please sign and either send electronically or make a copy of this report for your files and return this physician signed original.  Please mark one 1.__approve of plan  2. ___approve of plan with the following conditions.   ______________________________                                                          _____________________ Physician Signature                                                                                                              Date sbnr

## 2013-08-31 ENCOUNTER — Ambulatory Visit (HOSPITAL_COMMUNITY)
Admission: RE | Admit: 2013-08-31 | Discharge: 2013-08-31 | Disposition: A | Discharge status: Home / Self Care | Payer: BC Managed Care – PPO | Source: Ambulatory Visit | Attending: Family Medicine | Admitting: Family Medicine

## 2013-08-31 NOTE — Progress Notes (Signed)
Occupational Therapy Treatment Patient Details  Name: Michael Hester MRN: [id number] Date of Birth: 04/17/1966  Today's Date: 08/31/2013 Time: 1513-1600 OT Time Calculation (min): 47 min Manual 1513-1528 (15') TherExercises 1528-1600 (32')  Visit#: 9 of 16  Re-eval: 09/23/13    Authorization: n/a   Subjective Symptoms/Limitations Symptoms: S: It only hurts in places that i want to move it and it wont go.  Pain Assessment Currently in Pain?: Yes Pain Score: 1  Pain Location: Shoulder Pain Orientation: Right Pain Type: Acute pain Multiple Pain Sites: No  Precautions/Restrictions     Exercise/Treatments Supine Protraction: PROM;10 reps Horizontal ABduction: PROM;10 reps External Rotation: PROM;10 reps Internal Rotation: PROM;10 reps Flexion: PROM;10 reps ABduction: PROM;10 reps Seated External Rotation: AROM;10 reps Internal Rotation: AROM;10 reps     Sidelying Other Sidelying Exercises: shoulder stabilizer stretches x 5 reps each  Standing External Rotation: Theraband;15 reps Theraband Level (Shoulder External Rotation): Level 2 (Red) Internal Rotation: Theraband;15 reps Theraband Level (Shoulder Internal Rotation): Level 2 (Red) Extension: Theraband;15 reps Theraband Level (Shoulder Extension): Level 2 (Red) Row: Theraband;15 reps Theraband Level (Shoulder Row): Level 2 (Red) ROM / Strengthening / Isometric Strengthening UBE (Upper Arm Bike): 3' forward and 3' in reverse   Stretches Corner Stretch: 5 reps;10 seconds Other Shoulder Stretches: doorway stretches x 5 reps      Manual Therapy Manual Therapy: Myofascial release Myofascial Release: MFR and manual stretching to decrease pain and fascial restrictions and improve pain free mobility in right scapular, shoulder, trapezius, and upper arm region  Occupational Therapy Assessment and Plan OT Assessment and Plan Clinical Impression Statement: A:  Patient continues to make progress with AROM and  improved tolerance for PROM stretches this date.  Improved ability to place hand behind head.  Good tolerance for doorway/corner stretches.   OT Plan: P:  Follow up on HEP, attempt seated dowel rod exercises.   Goals Short Term Goals Short Term Goal 1: Patient will be independent with HEP. Short Term Goal 1 Progress: Met Short Term Goal 2: Patient will improve PROM in his right shoulder to [facility] for increased ability to reach shelving at work.  Short Term Goal 2 Progress: Progressing toward goal Short Term Goal 3: Patient will increase right shoulder strength to 4/5 for increased abilty to lift items overhead at work.  Short Term Goal 3 Progress: Met Short Term Goal 4: Patient will decrease pain in his right shoulder to 2/10 when rolling over in bed. Short Term Goal 4 Progress: Progressing toward goal Short Term Goal 5: Patient will decrease fascial restrictions to min-mod in his right shoulder region. Short Term Goal 5 Progress: Progressing toward goal Long Term Goals Long Term Goal 1: Patient will return to prior level of independence with all B/IADLS, work, and leisure activities, using his right arm as dominant.  Long Term Goal 1 Progress: Progressing toward goal Long Term Goal 2: Patient will improve AROM in his right shoulder to [facility] for increased ability to reach shelving at work.  Long Term Goal 2 Progress: Progressing toward goal Long Term Goal 4: Patient will decrease pain in his right shoulder to 1/10 when rolling over in bed. Long Term Goal 4 Progress: Progressing toward goal Long Term Goal 5: Patient will decrease fascial restrictions to minimal in his right shoulder region. Long Term Goal 5 Progress: Progressing toward goal  Problem List Patient Active Problem List   Diagnosis Date Noted  . Pain in joint, shoulder region 07/26/2013  . Muscle weakness (generalized) 07/26/2013  .  Capsulitis, adhesive shoulder 07/14/2013    End of Session Activity Tolerance: Patient  tolerated treatment well General Behavior During Therapy: [facility] for tasks assessed/performed  GO    Velora Mediate, OTR/L  08/31/2013, 4:38 PM

## 2013-09-02 ENCOUNTER — Ambulatory Visit (HOSPITAL_COMMUNITY)
Admission: RE | Admit: 2013-09-02 | Discharge: 2013-09-02 | Disposition: A | Discharge status: Home / Self Care | Payer: BC Managed Care – PPO | Source: Ambulatory Visit | Attending: Family Medicine | Admitting: Family Medicine

## 2013-09-02 NOTE — Progress Notes (Signed)
Occupational Therapy Treatment Patient Details  Name: Michael Hester MRN: [id number] Date of Birth: January 13, 1966  Today's Date: 09/02/2013 Time: 4098-1191 OT Time Calculation (min): 48 min Manual 1432-1452 (20') TherExercises 1452-1420 (28') Visit#: 10 of 16  Re-eval: 09/23/13    Authorization: n/a   Subjective Symptoms/Limitations Symptoms: S:  I am more sore than I have been but I feel like I am moving it more than I have been too,.  Pain Assessment Currently in Pain?: Yes Pain Score: 3  Pain Location: Shoulder Pain Orientation: Right Pain Type: Acute pain Multiple Pain Sites: No   Exercise/Treatments Supine Protraction: PROM;10 reps Horizontal ABduction: PROM;10 reps External Rotation: PROM;10 reps Internal Rotation: PROM;10 reps Flexion: PROM;10 reps ABduction: PROM;10 reps Other Supine Exercises: overhead weighted stretch with 5# weigth x 3 x Seated External Rotation: AROM;10 reps Internal Rotation: AROM;10 reps Prone  Other Prone Exercises: superman stretch x 3 x 30" Other Prone Exercises: airplane stretch x 3 x 30"   Standing External Rotation: Theraband;15 reps Theraband Level (Shoulder External Rotation): Level 2 (Red) Internal Rotation: Theraband;15 reps Theraband Level (Shoulder Internal Rotation): Level 2 (Red) Extension: Theraband;15 reps Theraband Level (Shoulder Extension): Level 2 (Red) Row: Theraband;15 reps Theraband Level (Shoulder Row): Level 2 (Red) ROM / Strengthening / Isometric Strengthening UBE (Upper Arm Bike): 3' forward and 3' in reverse level 3        Manual Therapy Manual Therapy: Myofascial release Myofascial Release: MFR and manual stretching to decrease pain and fascial restrictions and improve pain free mobility in right scapular, shoulder, trapezius, and upper arm region  Occupational Therapy Assessment and Plan OT Assessment and Plan Clinical Impression Statement: A:  Initiated superman and airplane stretches  this date with good tolerance.  Patient with noted popping in shoulder joint following and increased AROM flexion.  patietn reports completeing doorway stretches at home more frequently with some increased soreness.  3/10 pain at end of session  OT Plan: P:  Follow up on prone stretches; attempt seated dowel rod exercises.   Goals Short Term Goals Short Term Goal 1: Patient will be independent with HEP. Short Term Goal 1 Progress: Met Short Term Goal 2: Patient will improve PROM in his right shoulder to [facility] for increased ability to reach shelving at work.  Short Term Goal 2 Progress: Progressing toward goal Short Term Goal 3: Patient will increase right shoulder strength to 4/5 for increased abilty to lift items overhead at work.  Short Term Goal 3 Progress: Met Short Term Goal 4: Patient will decrease pain in his right shoulder to 2/10 when rolling over in bed. Short Term Goal 4 Progress: Progressing toward goal Short Term Goal 5: Patient will decrease fascial restrictions to min-mod in his right shoulder region. Short Term Goal 5 Progress: Progressing toward goal Long Term Goals Long Term Goal 1: Patient will return to prior level of independence with all B/IADLS, work, and leisure activities, using his right arm as dominant.  Long Term Goal 1 Progress: Progressing toward goal Long Term Goal 2: Patient will improve AROM in his right shoulder to [facility] for increased ability to reach shelving at work.  Long Term Goal 2 Progress: Progressing toward goal Long Term Goal 3: Patient will increase right shoulder strength to 5/5 for increased abilty to lift items overhead at work.  Long Term Goal 3 Progress: Progressing toward goal Long Term Goal 4: Patient will decrease pain in his right shoulder to 1/10 when rolling over in bed. Long Term Goal 4 Progress:  Progressing toward goal Long Term Goal 5: Patient will decrease fascial restrictions to minimal in his right shoulder region. Long Term Goal 5  Progress: Progressing toward goal  Problem List Patient Active Problem List   Diagnosis Date Noted  . Pain in joint, shoulder region 07/26/2013  . Muscle weakness (generalized) 07/26/2013  . Capsulitis, adhesive shoulder 07/14/2013    End of Session Activity Tolerance: Patient tolerated treatment well General Behavior During Therapy: [facility] for tasks assessed/performed  GO    Velora Mediate, OTR/L 09/02/2013, 3:25 PM

## 2013-09-07 ENCOUNTER — Ambulatory Visit (HOSPITAL_COMMUNITY)
Admission: RE | Admit: 2013-09-07 | Discharge: 2013-09-07 | Disposition: A | Discharge status: Home / Self Care | Payer: BC Managed Care – PPO | Source: Ambulatory Visit | Attending: Family Medicine | Admitting: Family Medicine

## 2013-09-07 DIAGNOSIS — M25511 Pain in right shoulder: Secondary | ICD-10-CM

## 2013-09-07 DIAGNOSIS — M6281 Muscle weakness (generalized): Secondary | ICD-10-CM

## 2013-09-08 NOTE — Progress Notes (Signed)
Occupational Therapy Treatment Patient Details  Name: Michael Hester MRN: [id number] Date of Birth: [birth year]  Today's Date: 09/07/2013 Time: 4098-1191 OT Time Calculation (min): 50 min Manual 1105-1125 (20') TherExercises 1125-1155 (30')  Visit#: 11 of 16  Re-eval: 09/23/13    Authorization: n/a   Subjective Symptoms/Limitations Symptoms: S:  I just worked a 12 hour shift, it really irritated my shoulder. Pain Assessment Currently in Pain?: Yes Pain Score: 5  Pain Location: Shoulder Pain Orientation: Right Pain Type: Acute pain Multiple Pain Sites: No   Exercise/Treatments Supine Protraction: PROM;10 reps Horizontal ABduction: PROM;10 reps;AAROM;5 reps;Weights Horizontal ABduction Weight (lbs): 2 External Rotation: PROM;10 reps;AAROM;5 reps;Weights External Rotation Weight (lbs): 2 Internal Rotation: PROM;10 reps;AAROM;5 reps;Weights Internal Rotation Weight (lbs): 2 Flexion: PROM;10 reps;AAROM;5 reps;Weights Shoulder Flexion Weight (lbs): 2 ABduction: PROM;10 reps;AAROM;5 reps;Weights Shoulder ABduction Weight (lbs): 2 Seated External Rotation: AROM;10 reps Internal Rotation: AROM;10 reps Pulleys Other Pulley Exercises: cybex column for full flexion stretch x 2 sets x 5 reps plate 6 ROM / Strengthening / Isometric Strengthening UBE (Upper Arm Bike): 3' forward and 3' in reverse level 3 Cybex Press: 2 plate (10 reps) Cybex Row: 2 plate (10 REPS)   Stretches External Rotation Stretch: 5 reps;10 seconds Manual Therapy Manual Therapy: Myofascial release Myofascial Release: MFR and manual stretching to decrease pain and fascial restrictions and improve pain free mobility in right scapular, shoulder, trapezius, and upper arm region  Occupational Therapy Assessment and Plan OT Assessment and Plan Clinical Impression Statement: A:  Patient reports completing airplane and superman stretches at home.  Initiated ER doorway stretch this date with min complaint of  increased discomfort.  He continues with increased tightness in shoulder girdle .  Cybex exercises this date with good tolerance  OT Plan: P:  Attempt seated dowel rod exercises    Goals Short Term Goals Short Term Goal 1: Patient will be independent with HEP. Short Term Goal 1 Progress: Met Short Term Goal 2: Patient will improve PROM in his right shoulder to [facility] for increased ability to reach shelving at work.  Short Term Goal 2 Progress: Progressing toward goal Short Term Goal 3: Patient will increase right shoulder strength to 4/5 for increased abilty to lift items overhead at work.  Short Term Goal 3 Progress: Met Short Term Goal 4: Patient will decrease pain in his right shoulder to 2/10 when rolling over in bed. Short Term Goal 4 Progress: Progressing toward goal Short Term Goal 5: Patient will decrease fascial restrictions to min-mod in his right shoulder region. Short Term Goal 5 Progress: Progressing toward goal Long Term Goals Long Term Goal 1: Patient will return to prior level of independence with all B/IADLS, work, and leisure activities, using his right arm as dominant.  Long Term Goal 1 Progress: Progressing toward goal Long Term Goal 2: Patient will improve AROM in his right shoulder to [facility] for increased ability to reach shelving at work.  Long Term Goal 2 Progress: Progressing toward goal Long Term Goal 3: Patient will increase right shoulder strength to 5/5 for increased abilty to lift items overhead at work.  Long Term Goal 3 Progress: Progressing toward goal Long Term Goal 4: Patient will decrease pain in his right shoulder to 1/10 when rolling over in bed. Long Term Goal 4 Progress: Progressing toward goal Long Term Goal 5: Patient will decrease fascial restrictions to minimal in his right shoulder region. Long Term Goal 5 Progress: Progressing toward goal  Problem List Patient Active Problem List  Diagnosis Date Noted  . Pain in joint, shoulder region 07/26/2013   . Muscle weakness (generalized) 07/26/2013  . Capsulitis, adhesive shoulder 07/14/2013    End of Session Activity Tolerance: Patient tolerated treatment well General Behavior During Therapy: [facility] for tasks assessed/performed  GO    [name],OTR/L 09/07/2013, 12:27 PM

## 2013-09-13 ENCOUNTER — Ambulatory Visit (HOSPITAL_COMMUNITY)
Admission: RE | Admit: 2013-09-13 | Discharge: 2013-09-13 | Disposition: A | Discharge status: Home / Self Care | Payer: BC Managed Care – PPO | Source: Ambulatory Visit | Attending: Family Medicine | Admitting: Family Medicine

## 2013-09-13 DIAGNOSIS — IMO0001 Reserved for inherently not codable concepts without codable children: Secondary | ICD-10-CM | POA: Insufficient documentation

## 2013-09-13 DIAGNOSIS — M62838 Other muscle spasm: Secondary | ICD-10-CM | POA: Insufficient documentation

## 2013-09-13 DIAGNOSIS — M25511 Pain in right shoulder: Secondary | ICD-10-CM

## 2013-09-13 DIAGNOSIS — M6281 Muscle weakness (generalized): Secondary | ICD-10-CM

## 2013-09-13 DIAGNOSIS — M25519 Pain in unspecified shoulder: Secondary | ICD-10-CM | POA: Insufficient documentation

## 2013-09-13 NOTE — Progress Notes (Signed)
Occupational Therapy Treatment Patient Details  Name: Michael Hester MRN: [id number] Date of Birth: [birth year]  Today's Date: 09/13/2013 Time: 4098-1191 OT Time Calculation (min): 50 min Manual therapy 4782-9562 23'  Therapeutic exercises 1308-6578 27' Visit#: 12 of 16  Re-eval: 09/23/13    Subjective S:  I used my arm to turn over on the mat, I havent been able to do that before.  Limitations: progress as tolerated Pain Assessment Currently in Pain?: Yes Pain Score: 3  Pain Location: Shoulder Pain Orientation: Right Pain Type: Acute pain  Precautions/Restrictions   progress as tolerated  Exercise/Treatments Supine Protraction: PROM;10 reps;AROM;5 reps Horizontal ABduction: PROM;10 reps;AROM;5 reps External Rotation: PROM;10 reps;AROM;5 reps Internal Rotation: PROM;10 reps;AROM;5 reps Flexion: PROM;10 reps;AROM;5 reps ABduction: PROM;10 reps;AROM;5 reps Other Supine Exercises: overhead stretch 3' with 5# X 2 Prone  Other Prone Exercises: superman stretch x 3 x 30" Other Prone Exercises: airplane stretch x 3 x 30" ROM / Strengthening / Isometric Strengthening UBE (Upper Arm Bike): 3' forward and 3' in reverse level 3      Manual Therapy Manual Therapy: Myofascial release Myofascial Release: MFR and manual stretching to decrease pain and fascial restrictions and improve pain free mobility in right scapular, shoulder, trapezius, and upper arm region  Occupational Therapy Assessment and Plan OT Assessment and Plan Clinical Impression Statement: A:  Added AROM in  supine, patient demonstrates near equal AROM to right arm this date with flexion and abduction. OT Plan: P:  add wall push ups and tband seated ext and adduction   Goals Short Term Goals Short Term Goal 1: Patient will be independent with HEP. Short Term Goal 2: Patient will improve PROM in his right shoulder to [facility] for increased ability to reach shelving at work.  Short Term Goal 3: Patient will increase  right shoulder strength to 4/5 for increased abilty to lift items overhead at work.  Short Term Goal 4: Patient will decrease pain in his right shoulder to 2/10 when rolling over in bed. Short Term Goal 5: Patient will decrease fascial restrictions to min-mod in his right shoulder region. Short Term Goal 5 Progress: Progressing toward goal Long Term Goals Long Term Goal 1: Patient will return to prior level of independence with all B/IADLS, work, and leisure activities, using his right arm as dominant.  Long Term Goal 1 Progress: Progressing toward goal Long Term Goal 2: Patient will improve AROM in his right shoulder to [facility] for increased ability to reach shelving at work.  Long Term Goal 2 Progress: Progressing toward goal Long Term Goal 3: Patient will increase right shoulder strength to 5/5 for increased abilty to lift items overhead at work.  Long Term Goal 3 Progress: Progressing toward goal Long Term Goal 4: Patient will decrease pain in his right shoulder to 1/10 when rolling over in bed. Long Term Goal 4 Progress: Progressing toward goal Long Term Goal 5: Patient will decrease fascial restrictions to minimal in his right shoulder region. Long Term Goal 5 Progress: Progressing toward goal  Problem List Patient Active Problem List   Diagnosis Date Noted  . Pain in joint, shoulder region 07/26/2013  . Muscle weakness (generalized) 07/26/2013  . Capsulitis, adhesive shoulder 07/14/2013    End of Session Activity Tolerance: Patient tolerated treatment well  GO    Shirlean Mylar, OTR/L  09/13/2013, 11:49 AM

## 2013-09-16 ENCOUNTER — Ambulatory Visit (HOSPITAL_COMMUNITY)
Admission: RE | Admit: 2013-09-16 | Discharge: 2013-09-16 | Disposition: A | Discharge status: Home / Self Care | Payer: BC Managed Care – PPO | Source: Ambulatory Visit | Attending: Family Medicine | Admitting: Family Medicine

## 2013-09-16 DIAGNOSIS — M25511 Pain in right shoulder: Secondary | ICD-10-CM

## 2013-09-16 DIAGNOSIS — M6281 Muscle weakness (generalized): Secondary | ICD-10-CM

## 2013-09-16 NOTE — Progress Notes (Signed)
Occupational Therapy Treatment Patient Details  Name: Michael Hester MRN: [id number] Date of Birth: [birth year]  Today's Date: 09/16/2013 Time: 1105-1205 OT Time Calculation (min): 60 min Manual Therapy 4098-1191 16' Therapeutic exercises 1121-1205 44' Visit#: 13 of 16  Re-eval: 09/23/13    Authorization:    Authorization Time Period:    Authorization Visit#:   of    Subjective Symptoms/Limitations Symptoms: S:  I dont know why that tightened up so much! (flexion was tight with PROM, patient able to improve range with SROM)  I can pull the door closed on my car with my right arm now.  I still cant quite reach the passenger seatbelt.  I can touch the ceiling of my [employer] now.   Pain Assessment Currently in Pain?: Yes Pain Score: 3  Pain Location: Shoulder Pain Orientation: Right Pain Type: Acute pain  Precautions/Restrictions     Exercise/Treatments Supine Protraction: AAROM;15 reps Horizontal ABduction: AAROM;15 reps External Rotation: AAROM;15 reps Internal Rotation: AAROM;15 reps Flexion: AAROM;15 reps ABduction: AAROM;15 reps Other Supine Exercises: overhead stretch 3' with 5# X 2 ROM / Strengthening / Isometric Strengthening UBE (Upper Arm Bike): 3' and 3' at 3.5 resistance Cybex Press: 2 plate;10 reps Cybex Row: 2 plate;10 reps Over Head Lace: 3'   Teacher, music: 5 reps;10 seconds Other Shoulder Stretches: doorway stretches x 5 reps      Manual Therapy Manual Therapy: Myofascial release Myofascial Release: MFR and manual stretching to decrease pain and fascial restrictions and improve pain free mobility in right scapular, shoulder, trapezius, and upper arm region  Occupational Therapy Assessment and Plan OT Assessment and Plan Clinical Impression Statement: A:  Focused on shoulder stretching and scapular stabillity this date. OT Plan: P:  Add wall pushups and increase contraction time with stretches   Goals Short Term Goals Short Term Goal 1:  Patient will be independent with HEP. Short Term Goal 2: Patient will improve PROM in his right shoulder to [facility] for increased ability to reach shelving at work.  Short Term Goal 3: Patient will increase right shoulder strength to 4/5 for increased abilty to lift items overhead at work.  Short Term Goal 4: Patient will decrease pain in his right shoulder to 2/10 when rolling over in bed. Short Term Goal 5: Patient will decrease fascial restrictions to min-mod in his right shoulder region. Long Term Goals Long Term Goal 1: Patient will return to prior level of independence with all B/IADLS, work, and leisure activities, using his right arm as dominant.  Long Term Goal 2: Patient will improve AROM in his right shoulder to [facility] for increased ability to reach shelving at work.  Long Term Goal 3: Patient will increase right shoulder strength to 5/5 for increased abilty to lift items overhead at work.  Long Term Goal 4: Patient will decrease pain in his right shoulder to 1/10 when rolling over in bed. Long Term Goal 5: Patient will decrease fascial restrictions to minimal in his right shoulder region.  Problem List Patient Active Problem List   Diagnosis Date Noted  . Pain in joint, shoulder region 07/26/2013  . Muscle weakness (generalized) 07/26/2013  . Capsulitis, adhesive shoulder 07/14/2013    End of Session Activity Tolerance: Patient tolerated treatment well General Behavior During Therapy: [facility] for tasks assessed/performed  GO    Jacqualine Code 09/16/2013, 11:58 AM

## 2013-09-21 ENCOUNTER — Ambulatory Visit (HOSPITAL_COMMUNITY)
Admission: RE | Admit: 2013-09-21 | Discharge: 2013-09-21 | Disposition: A | Discharge status: Home / Self Care | Payer: BC Managed Care – PPO | Source: Ambulatory Visit | Attending: Family Medicine | Admitting: Family Medicine

## 2013-09-21 DIAGNOSIS — M25511 Pain in right shoulder: Secondary | ICD-10-CM

## 2013-09-21 DIAGNOSIS — M6281 Muscle weakness (generalized): Secondary | ICD-10-CM

## 2013-09-21 NOTE — Progress Notes (Signed)
Occupational Therapy Treatment Patient Details  Name: Michael Hester MRN: [id number] Date of Birth: [birth year]  Today's Date: 09/21/2013 Time: 7829-5621 OT Time Calculation (min): 48 min Manual therapy 3086-5784 27' Therapeutic exercises 6962-9528 21' Visit#: 14 of 16  Re-eval: 09/23/13    Subjective S:  I havent felt well, I had alot of tightness over the past few days.  (flexion quited limited (90) at beginning of treatment. Pain Assessment Pain Score: 5  Pain Location: Shoulder Pain Orientation: Right Pain Type: Acute pain  Precautions/Restrictions   progress as tolerated  Exercise/Treatments Prone  Other Prone Exercises: prone military crawl 3 feet forward and 3' backward 3 sets Standing Other Standing Exercises: walked fingers up wall and lifted hand off wall holding 5 seconds x 5 reps and then repeated in abduction  ROM / Strengthening / Isometric Strengthening UBE (Upper Arm Bike): 3' and 3' at 3.5 resistance Cybex Press: 2.5 plate;15 reps Cybex Row: 2.5 plate;15 reps      Manual Therapy Manual Therapy: Other (comment) Myofascial Release: MFR and manual stretching to decrease pain and fascial restrictions and improve pain free mobility in right scapular, shoulder, trapezius, and upper arm region Other Manual Therapy: sleeper stretch in right sidelying at 90, 45, 105 30" hold each,  muscle energy technique into flexion 3 x10",   Occupational Therapy Assessment and Plan OT Assessment and Plan Clinical Impression Statement: A:  Added prone army crawl, MET, sleeper stretch to improve mobility this date. OT Plan: P:  Improve ability to stretch flexion and abduction on wall by 1" or more.    Goals Short Term Goals Short Term Goal 1: Patient will be independent with HEP. Short Term Goal 2: Patient will improve PROM in his right shoulder to [facility] for increased ability to reach shelving at work.  Short Term Goal 3: Patient will increase right shoulder strength to 4/5 for  increased abilty to lift items overhead at work.  Short Term Goal 4: Patient will decrease pain in his right shoulder to 2/10 when rolling over in bed. Short Term Goal 5: Patient will decrease fascial restrictions to min-mod in his right shoulder region. Long Term Goals Long Term Goal 1: Patient will return to prior level of independence with all B/IADLS, work, and leisure activities, using his right arm as dominant.  Long Term Goal 2: Patient will improve AROM in his right shoulder to [facility] for increased ability to reach shelving at work.  Long Term Goal 3: Patient will increase right shoulder strength to 5/5 for increased abilty to lift items overhead at work.  Long Term Goal 4: Patient will decrease pain in his right shoulder to 1/10 when rolling over in bed. Long Term Goal 5: Patient will decrease fascial restrictions to minimal in his right shoulder region.  Problem List Patient Active Problem List   Diagnosis Date Noted  . Pain in joint, shoulder region 07/26/2013  . Muscle weakness (generalized) 07/26/2013  . Capsulitis, adhesive shoulder 07/14/2013    End of Session Activity Tolerance: Patient tolerated treatment well General Behavior During Therapy: [facility] for tasks assessed/performed  GO    Shirlean Mylar, OTR/L  09/21/2013, 12:09 PM

## 2013-09-24 ENCOUNTER — Ambulatory Visit (HOSPITAL_COMMUNITY)
Admission: RE | Admit: 2013-09-24 | Discharge: 2013-09-24 | Disposition: A | Discharge status: Home / Self Care | Payer: BC Managed Care – PPO | Source: Ambulatory Visit | Attending: Family Medicine | Admitting: Family Medicine

## 2013-09-24 NOTE — Progress Notes (Signed)
Occupational Therapy Treatment Patient Details  Name: Michael Hester MRN: [id number] Date of Birth: [birth year]  Today's Date: 09/24/2013 Time: 8657-8469 OT Time Calculation (min): 47 min Manual 6295-2841 (20') TherExercises 1128-1155 (27')  Visit#: 15 of 16  Re-eval: 09/23/13    Authorization: n/a    Subjective Symptoms/Limitations Symptoms: S:  I feel like I am sore in places I havent been but I think it is because I am moving it more and differently Pain Assessment Currently in Pain?: Yes Pain Score: 4  Pain Location: Shoulder Pain Orientation: Right Pain Type: Acute pain Multiple Pain Sites: No   Exercise/Treatments Supine Flexion: AAROM;15 reps Shoulder Flexion Weight (lbs): 5 Other Supine Exercises: overhead stretch 3' with 5# X 3   Standing Other Standing Exercises: walked fingers up wall and lifted hand off wall holding 10 seconds x 5 reps and then repeated in abduction x 5 reps wtihout holding off wall at end.  working on proper positioning with LUE behind his back to work on squaring shoulders for abduction movement   Pulleys Other Pulley Exercises: cybex column for full flexion stretch x 2 sets x 10reps plate    ROM / Strengthening / Isometric Strengthening UBE (Upper Arm Bike): 3' and 3' at 3.5 resistance Cybex Press: 2.5 plate;15 reps Cybex Row: 2.5 plate;15 reps      Manual Therapy Manual Therapy: Myofascial release Myofascial Release: MFR and manual stretching to decrease pain and fascial restrictions and improve pain free mobility in right scapular, shoulder, trapezius, and upper arm region Other Manual Therapy: sleeper stretch in right sidelying at 90, 45, 105 30" hold each.  Weight Bearing Technique Weight Bearing Technique: No  Occupational Therapy Assessment and Plan OT Assessment and Plan Clinical Impression Statement: A:  Increased independent hold time from wall ladder for improving strength; overhead cybex stretch with improved endurance  for exercise and improved stretch this date.   OT Plan: P: Reassess;   Improve ability to stretch flexion and abduction on wall by 1" or more; TBAND HEP    Goals Short Term Goals Short Term Goal 1: Patient will be independent with HEP. Short Term Goal 1 Progress: Met Short Term Goal 2: Patient will improve PROM in his right shoulder to [facility] for increased ability to reach shelving at work.  Short Term Goal 2 Progress: Progressing toward goal Short Term Goal 3: Patient will increase right shoulder strength to 4/5 for increased abilty to lift items overhead at work.  Short Term Goal 3 Progress: Met Short Term Goal 4: Patient will decrease pain in his right shoulder to 2/10 when rolling over in bed. Short Term Goal 4 Progress: Progressing toward goal Short Term Goal 5: Patient will decrease fascial restrictions to min-mod in his right shoulder region. Short Term Goal 5 Progress: Progressing toward goal Long Term Goals Long Term Goal 1: Patient will return to prior level of independence with all B/IADLS, work, and leisure activities, using his right arm as dominant.  Long Term Goal 1 Progress: Progressing toward goal Long Term Goal 2: Patient will improve AROM in his right shoulder to [facility] for increased ability to reach shelving at work.  Long Term Goal 2 Progress: Progressing toward goal Long Term Goal 3: Patient will increase right shoulder strength to 5/5 for increased abilty to lift items overhead at work.  Long Term Goal 3 Progress: Progressing toward goal Long Term Goal 4: Patient will decrease pain in his right shoulder to 1/10 when rolling over in bed. Long Term Goal  4 Progress: Progressing toward goal Long Term Goal 5: Patient will decrease fascial restrictions to minimal in his right shoulder region. Long Term Goal 5 Progress: Progressing toward goal  Problem List Patient Active Problem List   Diagnosis Date Noted  . Pain in joint, shoulder region 07/26/2013  . Muscle weakness  (generalized) 07/26/2013  . Capsulitis, adhesive shoulder 07/14/2013    End of Session Activity Tolerance: Patient tolerated treatment well General Behavior During Therapy: [facility] for tasks assessed/performed  GO    Velora Mediate, OTR/L  09/24/2013, 12:16 PM

## 2013-09-28 ENCOUNTER — Ambulatory Visit (HOSPITAL_COMMUNITY)
Admission: RE | Admit: 2013-09-28 | Discharge: 2013-09-28 | Disposition: A | Discharge status: Home / Self Care | Payer: BC Managed Care – PPO | Source: Ambulatory Visit | Attending: Family Medicine | Admitting: Family Medicine

## 2013-09-28 DIAGNOSIS — M25511 Pain in right shoulder: Secondary | ICD-10-CM

## 2013-09-28 DIAGNOSIS — M6281 Muscle weakness (generalized): Secondary | ICD-10-CM

## 2013-09-28 NOTE — Evaluation (Signed)
Occupational Therapy Re-Evaluation  Patient Details  Name: Michael Hester MRN: [id number] Date of Birth: Feb 01, 1966  Today's Date: 09/28/2013 Time: 4098-1191 OT Time Calculation (min): 43 min Manual Therapy 4782-9562 11' Reassessment ROM/MMT 1308-6578 10' Therapeutic exercises 1128-1150 22'  Visit#: 16 of 24  Re-eval: 10/26/13       Past Medical History: No past medical history on file. Past Surgical History: No past surgical history on file.  Subjective  S:  I can close the passenger side car door now and adjust my rearview mirror, and reach farther across the table with less hesitation.  I am still having my shoulder tighten up and lose mobility throughout the day, and reaching overhead is difficult.  I have not tried golfing.  .  It does stay looser longer.   Special Tests: FOTO score was 61 one month ago and is currently is 71.9.  Additional Assessments RUE AROM (degrees) RUE Overall AROM Comments: assessed in seated ER/IR with shoulder adducted (08/26/13) Right Shoulder Flexion: 128 Degrees (116) Right Shoulder ABduction: 116 Degrees (108) Right Shoulder Internal Rotation: 80 Degrees (80) Right Shoulder External Rotation: 70 Degrees (64) RUE PROM (degrees) Right Shoulder Flexion: 140 Degrees (135) Right Shoulder ABduction: 125 Degrees (115) Right Shoulder Internal Rotation: 80 Degrees (80) Right Shoulder External Rotation: 70 Degrees (64) RUE Strength Right Shoulder Flexion:  (4+/5 (4/5)) Right Shoulder ABduction: 4/5 (4/5) Right Shoulder Internal Rotation:  (4+/5 (4/5)) Right Shoulder External Rotation: 4/5 (4/5)     Exercise/Treatments Standing Other Standing Exercises: walked fingers up wall and lifted hand off wall holding 10 seconds x 5 reps and then repeated in abduction x 5 reps wtihout holding off wall at end.  working on proper positioning with LUE behind his back to work on squaring shoulders for abduction movement        Manual Therapy Manual  Therapy: Myofascial release Myofascial Release: MFR and manual stretching to decrease pain and fascial restrictions and improve pain free mobility in right scapular, shoulder, trapezius, and upper arm region Other Manual Therapy: sleeper stretch in right sidelying at 90, 45, 105 30" hold each  Occupational Therapy Assessment and Plan OT Assessment and Plan Clinical Impression Statement: A:  Monthly progress note completed this date.  Patient has made improvements in range of motion and strength, and range is nearing his non involved LUE.  He has met 4/6 short term goals and is progressing towards remaining short term goals and all long term goals.  OT Plan: P:  Continue treatment 1 x week beginning next week x 4 weeks to improve ability to reach into abduction without pain.    Goals Short Term Goals Short Term Goal 1: Patient will be independent with HEP. Short Term Goal 2: Patient will improve PROM in his right shoulder to [facility] for increased ability to reach shelving at work.  Short Term Goal 3: Patient will increase right shoulder strength to 4/5 for increased abilty to lift items overhead at work.  Short Term Goal 4: Patient will decrease pain in his right shoulder to 2/10 when rolling over in bed. Short Term Goal 4 Progress: Progressing toward goal Short Term Goal 5: Patient will decrease fascial restrictions to min-mod in his right shoulder region. Short Term Goal 5 Progress: Progressing toward goal Long Term Goals Long Term Goal 1: Patient will return to prior level of independence with all B/IADLS, work, and leisure activities, using his right arm as dominant.  Long Term Goal 1 Progress: Progressing toward goal Long Term Goal  2: Patient will improve AROM in his right shoulder to [facility] for increased ability to reach shelving at work.  Long Term Goal 2 Progress: Progressing toward goal Long Term Goal 3: Patient will increase right shoulder strength to 5/5 for increased abilty to lift items  overhead at work.  Long Term Goal 3 Progress: Progressing toward goal Long Term Goal 4: Patient will decrease pain in his right shoulder to 1/10 when rolling over in bed. Long Term Goal 4 Progress: Progressing toward goal Long Term Goal 5: Patient will decrease fascial restrictions to minimal in his right shoulder region. Long Term Goal 5 Progress: Progressing toward goal  Problem List Patient Active Problem List   Diagnosis Date Noted  . Pain in joint, shoulder region 07/26/2013  . Muscle weakness (generalized) 07/26/2013  . Capsulitis, adhesive shoulder 07/14/2013    End of Session Activity Tolerance: Patient tolerated treatment well General Behavior During Therapy: WFL for tasks assessed/performed OT Plan of Care OT Home Exercise Plan: Tband HEP for scapular stability.  GO    Shirlean Mylar, OTR/L  09/28/2013, 12:07 PM  Physician Documentation Your signature is required to indicate approval of the treatment plan as stated above.  Please sign and either send electronically or make a copy of this report for your files and return this physician signed original.  Please mark one 1.__approve of plan  2. ___approve of plan with the following conditions.   ______________________________                                                          _____________________ Physician Signature                                                                                                             Date

## 2013-10-01 ENCOUNTER — Ambulatory Visit (HOSPITAL_COMMUNITY)
Admission: RE | Admit: 2013-10-01 | Discharge: 2013-10-01 | Disposition: A | Discharge status: Home / Self Care | Payer: BC Managed Care – PPO | Source: Ambulatory Visit | Attending: Orthopedic Surgery | Admitting: Orthopedic Surgery

## 2013-10-01 DIAGNOSIS — M6281 Muscle weakness (generalized): Secondary | ICD-10-CM

## 2013-10-01 DIAGNOSIS — M25511 Pain in right shoulder: Secondary | ICD-10-CM

## 2013-10-01 NOTE — Progress Notes (Signed)
Occupational Therapy Treatment Patient Details  Name: Michael Hester MRN: [id number] Date of Birth: May 14, 1966  Today's Date: 10/01/2013 Time: 1110-1200 OT Time Calculation (min): 50 min Manual therapy 1110-1135 25' Therapeutic exercises 1135-1200 25'  Visit#: 17 of 24  Re-eval: 10/26/13     Subjective  S:  Its a little tight.  Discussed HEP of not doing so many exercises - still had inflammation, maybe not as much as normal.  Maybe I shouldnt stretch and work it before I come into therapy - OTR/L agreed and will try this for HEP. Pain Assessment Currently in Pain?: Yes Pain Score: 3  Pain Location: Shoulder Pain Orientation: Right Pain Type: Acute pain  Precautions/Restrictions   progress as tolerated   Exercise/Treatments Prone  Other Prone Exercises: prone military crawl 3 feet forward and 3' backward 3 sets Other Prone Exercises: airplane stretch x 3 x 30" Standing Other Standing Exercises: walked fingers up wall and lifted hand off wall holding 10 seconds x 5 reps and then repeated in abduction x 5 reps wtihout holding off wall at end.  working on proper positioning with LUE behind his back to work on squaring shoulders for abduction movement  improved abduction by 1", flexion remained the same ROM / Strengthening / Isometric Strengthening UBE (Upper Arm Bike): 3' and 3' 4.0  Cybex Press: 3 plate Cybex Row: 3 plate;15 reps Over Head Lace: 3' with 1#       Manual Therapy Manual Therapy: Myofascial release Myofascial Release: MFR and manual stretching to decrease pain and fascial restrictions and improve pain free mobility in right scapular, shoulder, trapezius, and upper arm region Other Manual Therapy: sleeper stretch in right sidelying at 90, 45, 105 30" hold each  Occupational Therapy Assessment and Plan OT Assessment and Plan Clinical Impression Statement: A:  Added weight to overhead lace and added resistance to cybex press and row, more mobility with sleeper  stretch this date.  OT Plan: P:  prone strengthening.    Goals Short Term Goals Short Term Goal 1: Patient will be independent with HEP. Short Term Goal 2: Patient will improve PROM in his right shoulder to [facility] for increased ability to reach shelving at work.  Short Term Goal 3: Patient will increase right shoulder strength to 4/5 for increased abilty to lift items overhead at work.  Short Term Goal 4: Patient will decrease pain in his right shoulder to 2/10 when rolling over in bed. Short Term Goal 5: Patient will decrease fascial restrictions to min-mod in his right shoulder region. Long Term Goals Long Term Goal 1: Patient will return to prior level of independence with all B/IADLS, work, and leisure activities, using his right arm as dominant.  Long Term Goal 2: Patient will improve AROM in his right shoulder to [facility] for increased ability to reach shelving at work.  Long Term Goal 3: Patient will increase right shoulder strength to 5/5 for increased abilty to lift items overhead at work.  Long Term Goal 4: Patient will decrease pain in his right shoulder to 1/10 when rolling over in bed. Long Term Goal 5: Patient will decrease fascial restrictions to minimal in his right shoulder region.  Problem List Patient Active Problem List   Diagnosis Date Noted  . Pain in joint, shoulder region 07/26/2013  . Muscle weakness (generalized) 07/26/2013  . Capsulitis, adhesive shoulder 07/14/2013    End of Session Activity Tolerance: Patient tolerated treatment well General Behavior During Therapy: [facility] for tasks assessed/performed  GO  Shirlean Mylar, OTR/L  10/01/2013, 12:03 PM

## 2013-10-04 ENCOUNTER — Ambulatory Visit (HOSPITAL_COMMUNITY)
Admission: RE | Admit: 2013-10-04 | Discharge: 2013-10-04 | Disposition: A | Discharge status: Home / Self Care | Payer: BC Managed Care – PPO | Source: Ambulatory Visit | Attending: Family Medicine | Admitting: Family Medicine

## 2013-10-04 DIAGNOSIS — M25511 Pain in right shoulder: Secondary | ICD-10-CM

## 2013-10-04 DIAGNOSIS — M6281 Muscle weakness (generalized): Secondary | ICD-10-CM

## 2013-10-04 NOTE — Progress Notes (Signed)
Occupational Therapy Treatment Patient Details  Name: Michael Hester MRN: [id number] Date of Birth: Feb 23, 1966  Today's Date: 10/04/2013 Time: 4782-9562 OT Time Calculation (min): 56 min Manual therapy 1103-1130 27'  Therapeutic exercises 1130-1159 29'  Visit#: 18 of 24  Re-eval: 10/26/13     Subjective S:  I did what we talked about, I only stretched a bit in the shower and didnt do any other exercises before coming today.  Limitations: progress as tolerated Pain Assessment Currently in Pain?: Yes Pain Score: 3  Pain Location: Shoulder Pain Orientation: Right Pain Type: Acute pain  Exercise/Treatments Supine Protraction: PROM;10 reps Horizontal ABduction: PROM;10 reps External Rotation: PROM;10 reps Internal Rotation: PROM;10 reps Flexion: PROM;10 reps ABduction: PROM;10 reps Other Supine Exercises: overhead stretch 2' with 8# X 3 sets Prone  Retraction: Strengthening;10 reps Retraction Weight (lbs): 1 Extension: Strengthening;10 reps Extension Weight (lbs): 1 External Rotation: Strengthening;10 reps External Rotation Weight (lbs): 1 Internal Rotation: Strengthening;10 reps Internal Rotation Weight (lbs): 1 Horizontal ABduction 1: Strengthening;12 reps;10 reps Horizontal ABduction 1 Weight (lbs): 1 Horizontal ABduction 2: Strengthening;10 reps Horizontal ABduction 2 Weight (lbs): 1 Other Prone Exercises: prone military crawl 3 feet forward and 3' backward 3 sets Other Prone Exercises: airplane stretch x 3 x 40" ROM / Strengthening / Isometric Strengthening Cybex Press: 3 plate;20 reps Cybex Row: 3 plate;20 reps Over Head Lace: 3.5' with 1#      Manual Therapy Manual Therapy: Myofascial release Myofascial Release: MFR and manual stretching to decrease pain and fascial restrictions and improve pain free mobility in right scapular, shoulder, trapezius, and upper arm region  Occupational Therapy Assessment and Plan OT Assessment and Plan Clinical Impression  Statement: A: Patient with increased restrictions this date, particularly into abduction.  Increased pain in anterior shoulder this date. Added prone strengthening for scapular stabilty with 1# resistance which was very challenging for patient, 75% range achieved.    OT Plan: P:  Increase ability to complete full movement arc with prone strengthening.    Goals Short Term Goals Short Term Goal 1: Patient will be independent with HEP. Short Term Goal 2: Patient will improve PROM in his right shoulder to [facility] for increased ability to reach shelving at work.  Short Term Goal 3: Patient will increase right shoulder strength to 4/5 for increased abilty to lift items overhead at work.  Short Term Goal 4: Patient will decrease pain in his right shoulder to 2/10 when rolling over in bed. Short Term Goal 5: Patient will decrease fascial restrictions to min-mod in his right shoulder region. Long Term Goals Long Term Goal 1: Patient will return to prior level of independence with all B/IADLS, work, and leisure activities, using his right arm as dominant.  Long Term Goal 1 Progress: Progressing toward goal Long Term Goal 2: Patient will improve AROM in his right shoulder to [facility] for increased ability to reach shelving at work.  Long Term Goal 2 Progress: Progressing toward goal Long Term Goal 3: Patient will increase right shoulder strength to 5/5 for increased abilty to lift items overhead at work.  Long Term Goal 3 Progress: Progressing toward goal Long Term Goal 4: Patient will decrease pain in his right shoulder to 1/10 when rolling over in bed. Long Term Goal 4 Progress: Progressing toward goal Long Term Goal 5: Patient will decrease fascial restrictions to minimal in his right shoulder region. Long Term Goal 5 Progress: Progressing toward goal  Problem List Patient Active Problem List   Diagnosis Date Noted  .  Pain in joint, shoulder region 07/26/2013  . Muscle weakness (generalized) 07/26/2013  .  Capsulitis, adhesive shoulder 07/14/2013    End of Session Activity Tolerance: Patient tolerated treatment well General Behavior During Therapy: [facility] for tasks assessed/performed OT Plan of Care OT Home Exercise Plan: Discussed not completing strenuous exercises on day of therapy. Consulted and Agree with Plan of Care: Patient  GO    Shirlean Mylar, OTR/L  10/04/2013, 12:07 PM

## 2013-10-12 ENCOUNTER — Ambulatory Visit (INDEPENDENT_AMBULATORY_CARE_PROVIDER_SITE_OTHER): Payer: BC Managed Care – PPO | Admitting: Orthopedic Surgery

## 2013-10-12 VITALS — BP 161/112 | Ht 65.0 in | Wt 220.0 lb

## 2013-10-12 DIAGNOSIS — M7500 Adhesive capsulitis of unspecified shoulder: Secondary | ICD-10-CM

## 2013-10-12 DIAGNOSIS — M7501 Adhesive capsulitis of right shoulder: Secondary | ICD-10-CM

## 2013-10-12 MED ORDER — PREDNISONE (PAK) 10 MG PO TABS: ORAL_TABLET | ORAL | Status: DC

## 2013-10-12 NOTE — Progress Notes (Signed)
Patient ID: Michael Hester, male   DOB: 14-Jan-1966, 47 y.o.   MRN: [id number] Chief Complaint  Patient presents with  . Follow-up    3 month recheck right shoulder s/p therapy    Three-month followup 46 capsulitis. MRI did not show surgical tear of the rotator cuff  Patient is doing better her motion is improving he does have some inflammatory symptoms, flexion is now 110 external rotation is 45.  Recommend dose pack at this point he can continue Norco will see him again in 3 months he'll do therapy twice a week and then home therapy in between his physical therapy sessions.

## 2013-10-20 ENCOUNTER — Ambulatory Visit (HOSPITAL_COMMUNITY)
Admission: RE | Admit: 2013-10-20 | Discharge: 2013-10-20 | Disposition: A | Discharge status: Home / Self Care | Payer: BC Managed Care – PPO | Source: Ambulatory Visit | Attending: Family Medicine | Admitting: Family Medicine

## 2013-10-20 DIAGNOSIS — IMO0001 Reserved for inherently not codable concepts without codable children: Secondary | ICD-10-CM | POA: Insufficient documentation

## 2013-10-20 DIAGNOSIS — M6281 Muscle weakness (generalized): Secondary | ICD-10-CM | POA: Insufficient documentation

## 2013-10-20 DIAGNOSIS — M62838 Other muscle spasm: Secondary | ICD-10-CM | POA: Insufficient documentation

## 2013-10-20 DIAGNOSIS — M25519 Pain in unspecified shoulder: Secondary | ICD-10-CM | POA: Insufficient documentation

## 2013-10-20 DIAGNOSIS — M25511 Pain in right shoulder: Secondary | ICD-10-CM

## 2013-10-20 NOTE — Progress Notes (Signed)
Occupational Therapy Treatment Patient Details  Name: Michael Hester MRN: [id number] Date of Birth: [birth year]  Today's Date: 10/20/2013 Time: 1020-1105 OT Time Calculation (min): 45 min Therapeutic exercises 45'  Visit#: 19 of 24  Re-eval: 11/03/13    Authorization: n/a  Subjective S:  I went back to [place], he gave me prednisone and it feels better.  I want to focus on strengthening mainly now.  (discussed treatment focus with patient, will dc manual therapy, educate on ER?IR stretches and focus on strengthening and scapular stability for improved scapulohumeral rhthym.   Pain Assessment Currently in Pain?: Yes Pain Score: 2  Pain Location: Shoulder Pain Orientation: Right Pain Type: Acute pain  Exercise/Treatments   ROM / Strengthening / Isometric Strengthening Pushups: 10 reps (modified push up) Other ROM/Strengthening Exercises: in quadraped walked arms out to push up position and then flexed shoulder through available range 10 times   Stretches External Rotation Stretch: 5 reps;20 seconds (standing door frame stretch and seated at table) Other Shoulder Stretches: inferior capsule stretch Other Shoulder Stretches: table dip extension stretch Power Tower Extension: 10 reps (6 degrees) Retraction: 10 reps (6 degrees) Row: 10 reps (6 degrees) External Rotation: 10 reps (4 degrees) Internal Rotation: 10 reps (4 degrees) Body Blade Flexion:  (10 times full range) ABduction:  (10 times to 110 degrees) External Rotation:  (10 times to 50 degrees) Other Body Blade Exercises: extension 10 times full range    Manual Therapy Myofascial Release: DC MFR and manual stretches to PRN  Occupational Therapy Assessment and Plan OT Assessment and Plan Clinical Impression Statement: A:  (discussed treatment focus with patient, will dc manual therapy, educate on ER/IR stretches and focus on strengthening and scapular stability for improved scapulohumeral rhthym.   OT Plan: P:   Follow up on stretches for home increase degree of incline on power towel add supine extension and adduction on power tower.   Goals Short Term Goals Short Term Goal 1: Patient will be independent with HEP. Short Term Goal 2: Patient will improve PROM in his right shoulder to [facility] for increased ability to reach shelving at work.  Short Term Goal 3: Patient will increase right shoulder strength to 4/5 for increased abilty to lift items overhead at work.  Short Term Goal 4: Patient will decrease pain in his right shoulder to 2/10 when rolling over in bed. Short Term Goal 4 Progress: Progressing toward goal Short Term Goal 5: Patient will decrease fascial restrictions to min-mod in his right shoulder region. Short Term Goal 5 Progress: Progressing toward goal Long Term Goals Long Term Goal 1: Patient will return to prior level of independence with all B/IADLS, work, and leisure activities, using his right arm as dominant.  Long Term Goal 1 Progress: Progressing toward goal Long Term Goal 2: Patient will improve AROM in his right shoulder to [facility] for increased ability to reach shelving at work.  Long Term Goal 2 Progress: Progressing toward goal Long Term Goal 3: Patient will increase right shoulder strength to 5/5 for increased abilty to lift items overhead at work.  Long Term Goal 3 Progress: Progressing toward goal Long Term Goal 4: Patient will decrease pain in his right shoulder to 1/10 when rolling over in bed. Long Term Goal 4 Progress: Progressing toward goal Long Term Goal 5: Patient will decrease fascial restrictions to minimal in his right shoulder region. Long Term Goal 5 Progress: Progressing toward goal  Problem List Patient Active Problem List   Diagnosis Date Noted  .  Pain in joint, shoulder region 07/26/2013  . Muscle weakness (generalized) 07/26/2013  . Capsulitis, adhesive shoulder 07/14/2013    End of Session Activity Tolerance: Patient tolerated treatment  well General Behavior During Therapy: WFL for tasks assessed/performed OT Plan of Care OT Home Exercise Plan: stretches:  ER in door frame and seated at table, inferior capsule stretch, table dips for stretch into extension  OT Patient Instructions: hand out scanned Consulted and Agree with Plan of Care: Patient  GO    Shirlean Mylar, OTR/L  10/20/2013, 11:31 AM

## 2013-10-25 ENCOUNTER — Ambulatory Visit (HOSPITAL_COMMUNITY)
Admission: RE | Admit: 2013-10-25 | Discharge: 2013-10-25 | Disposition: A | Discharge status: Home / Self Care | Payer: BC Managed Care – PPO | Source: Ambulatory Visit | Attending: Family Medicine | Admitting: Family Medicine

## 2013-10-25 NOTE — Progress Notes (Signed)
Occupational Therapy Treatment Patient Details  Name: SALIOU BARNIER MRN: [id number] Date of Birth: 1966/07/04  Today's Date: 10/25/2013 Time: 0320-0405 OT Time Calculation (min): 45 min Therapeutic Exercises 320-405 (45')  Visit#: 20 of 24  Re-eval: 11/03/13   Authorization: n/a   Subjective Symptoms/Limitations Symptoms: S: "Its actually my other side that's hurting me more now - its starting to get inflammed." Pain Assessment Pain Score: 2  Pain Location: Shoulder Pain Orientation: Right;Left   Exercise/Treatments  ROM / Strengthening / Isometric Strengthening Pushups: 10 reps (modified push up) Other ROM/Strengthening Exercises: in quad - side-to-side on bosu ball - 15 reps   Power Tower Extension: 10 reps (in supine, at 4 deg; 12 reps, in sitting forward, 6 degrees) Row:  (12 reps, 6 degrees, in sitting forward) External Rotation:  (12 reps, at 4 degrees, in sitting forward) Internal Rotation:  (12 reps at 6 degrees, in sitting forward) Flexion: 10 reps (supine, 4 degrees) Other Power Tower Exercises: Adduction - 10 reps at 4 degrees, kneeling to side Body Blade Flexion:  (12 reps in full range) ABduction:  (10 reps through full range) External Rotation:  (10 reps to 50 degrees)  Occupational Therapy Assessment and Plan OT Assessment and Plan Clinical Impression Statement: A: Pt is expressing concerns about further inflamming his opposite (left shoulder) - discused with pt options and strategies to maintain left UE (exercise reptitions per day, use of heat/ice). Will cont to follow up next session. OT Plan: P: Follow up on HEP, therepy 1x per week. Increase incline on power tower. Follow up on L shoulder pain.   Goals Short Term Goals Short Term Goal 2: Patient will improve PROM in his right shoulder to [facility] for increased ability to reach shelving at work.  Short Term Goal 2 Progress: Progressing toward goal Short Term Goal 4: Patient will decrease pain in his  right shoulder to 2/10 when rolling over in bed. Short Term Goal 4 Progress: Progressing toward goal Short Term Goal 5: Patient will decrease fascial restrictions to min-mod in his right shoulder region. Short Term Goal 5 Progress: Progressing toward goal Long Term Goals Long Term Goal 1: Patient will return to prior level of independence with all B/IADLS, work, and leisure activities, using his right arm as dominant.  Long Term Goal 1 Progress: Progressing toward goal Long Term Goal 2: Patient will improve AROM in his right shoulder to [facility] for increased ability to reach shelving at work.  Long Term Goal 2 Progress: Progressing toward goal Long Term Goal 3: Patient will increase right shoulder strength to 5/5 for increased abilty to lift items overhead at work.  Long Term Goal 3 Progress: Progressing toward goal Long Term Goal 4: Patient will decrease pain in his right shoulder to 1/10 when rolling over in bed. Long Term Goal 4 Progress: Progressing toward goal Long Term Goal 5: Patient will decrease fascial restrictions to minimal in his right shoulder region. Long Term Goal 5 Progress: Progressing toward goal  Problem List Patient Active Problem List   Diagnosis Date Noted  . Pain in joint, shoulder region 07/26/2013  . Muscle weakness (generalized) 07/26/2013  . Capsulitis, adhesive shoulder 07/14/2013    End of Session Activity Tolerance: Patient tolerated treatment well General Behavior During Therapy: [facility] for tasks assessed/performed  GO   Marry Guan, MS, OTR/L [phone]  10/25/2013, 4:19 PM

## 2013-11-02 ENCOUNTER — Ambulatory Visit (HOSPITAL_COMMUNITY)
Admission: RE | Admit: 2013-11-02 | Discharge: 2013-11-02 | Disposition: A | Discharge status: Home / Self Care | Payer: BC Managed Care – PPO | Source: Ambulatory Visit | Attending: Family Medicine | Admitting: Family Medicine

## 2013-11-02 NOTE — Progress Notes (Signed)
Occupational Therapy Treatment Patient Details  Name: FREDRIK MOGEL MRN: [id number] Date of Birth: [birth year]  Today's Date: 11/02/2013 Time: 9407-6808 OT Time Calculation (min): 46 min TherExercises 1109-1155 (73')  Visit#: 21 of 24  Re-eval: 11/03/13    Authorization: n/a    Subjective Symptoms/Limitations Symptoms: S:  I was really sore from the pushups we did the last time... it took me like four days to recover the last time.  Pain Assessment Currently in Pain?: Yes Pain Score: 2  Pain Location: Shoulder Pain Orientation: Right Pain Type: Acute pain      Exercise/Treatments   Prone  Other Prone Exercises: [place] exercises 1,2,3 and 6 prone over therapy ball x 10 reps each with moderate cues for form    Standing Other Standing Exercises: D1, D2 with green theraband x 10 reps each  Pulleys Other Pulley Exercises: Cybex column for pulldowns arms/elbows extended in standing plate 5 x 10 reps   Other Pulley Exercises: attempted high pulls in standing however patient unable to abcuct  ROM / Strengthening / Isometric Strengthening "W" Arms: prone over therapy ball x 10 reps each          Occupational Therapy Assessment and Plan OT Assessment and Plan Clinical Impression Statement: A:  Patient with increased difficulty with abduction/IR movements.  Patient noted to have tightness and stiff end feel to movement.  Educated on, instructed and performed DI, D2 exercises with mod difficulty with green band.  Patient demos improved range however with complaint of increased soreness in bilateral upper trapezius regions this date and reports increased soreness in LUE over weekend.  good return demo of prone exercises over ball this date.  OT Plan: P: Reassessment.  follow up on D1, D2 exercises and UE tightness with abduction/IR movement    Goals Short Term Goals Short Term Goal 1: Patient will be independent with HEP. Short Term Goal 1 Progress: Met Short Term Goal 2:  Patient will improve PROM in his right shoulder to [facility] for increased ability to reach shelving at work.  Short Term Goal 2 Progress: Progressing toward goal Short Term Goal 3: Patient will increase right shoulder strength to 4/5 for increased abilty to lift items overhead at work.  Short Term Goal 3 Progress: Met Short Term Goal 4: Patient will decrease pain in his right shoulder to 2/10 when rolling over in bed. Short Term Goal 4 Progress: Met Short Term Goal 5: Patient will decrease fascial restrictions to min-mod in his right shoulder region. Short Term Goal 5 Progress: Progressing toward goal Long Term Goals Long Term Goal 1: Patient will return to prior level of independence with all B/IADLS, work, and leisure activities, using his right arm as dominant.  Long Term Goal 1 Progress: Progressing toward goal Long Term Goal 2: Patient will improve AROM in his right shoulder to [facility] for increased ability to reach shelving at work.  Long Term Goal 2 Progress: Progressing toward goal Long Term Goal 3: Patient will increase right shoulder strength to 5/5 for increased abilty to lift items overhead at work.  Long Term Goal 3 Progress: Progressing toward goal Long Term Goal 4: Patient will decrease pain in his right shoulder to 1/10 when rolling over in bed. Long Term Goal 4 Progress: Progressing toward goal Long Term Goal 5: Patient will decrease fascial restrictions to minimal in his right shoulder region. Long Term Goal 5 Progress: Progressing toward goal  Problem List Patient Active Problem List   Diagnosis Date Noted  .  Pain in joint, shoulder region 07/26/2013  . Muscle weakness (generalized) 07/26/2013  . Capsulitis, adhesive shoulder 07/14/2013    End of Session Activity Tolerance: Patient tolerated treatment well General Behavior During Therapy: WFL for tasks assessed/performed  GO    Heather Lester, OTR/L  11/02/2013, 1:21 PM 

## 2013-11-09 ENCOUNTER — Ambulatory Visit (HOSPITAL_COMMUNITY)
Admission: RE | Admit: 2013-11-09 | Discharge: 2013-11-09 | Disposition: A | Discharge status: Home / Self Care | Payer: BC Managed Care – PPO | Source: Ambulatory Visit | Attending: Family Medicine | Admitting: Family Medicine

## 2013-11-09 NOTE — Evaluation (Signed)
Occupational Therapy Discharge  Patient Details  Name: Michael Hester MRN: [id number] Date of Birth: November 04, 1965  Today's Date: 11/09/2013 Time: 3532-9924 OT Time Calculation (min): 31 min Re-Evaluation 1524-1535 (11') Therapeutic Exercises 1535-1555 (20')  Visit#: 22 of 24  Re-eval: 11/09/13     Authorization: n/a  Authorization Time Period:    Authorization Visit#:   of     Past Medical History: No past medical history on file. Past Surgical History: No past surgical history on file.  Subjective Symptoms/Limitations Symptoms: S: I feel like I'm almost back to normal - I'm just a little bit off. But I know what I can do at home." Special Tests: FOTO score is 81. Previous score was 72. Initial score was 51. Pain Assessment Currently in Pain?: Yes Pain Score: 2  Pain Location: Shoulder Pain Orientation: Right Pain Type: Acute pain  Assessment Additional Assessments RUE AROM (degrees) RUE Overall AROM Comments: assessed in standing; ER/IR with shoulder adducted (08/26/13) Right Shoulder Flexion: 136 Degrees (128) Right Shoulder ABduction: 135 Degrees (116) Right Shoulder Internal Rotation: 80 Degrees (80) Right Shoulder External Rotation: 52 Degrees (52) RUE Strength Right Shoulder Flexion: 5/5 (4/5) Right Shoulder ABduction: 5/5 (4/5) Right Shoulder Internal Rotation: 5/5 (4+/5) Right Shoulder External Rotation: 5/5 (4/5) Palpation Palpation: min fascial restrictions     Exercise/Treatments Standing Other Standing Exercises: Reviewed D1, D2 exercises w green theraband - 5 reps each Educated patient on continuation of exercise program     Occupational Therapy Assessment and Plan OT Assessment and Plan Clinical Impression Statement: A: Pt has met all (5/5) short term goals and 3/5 long term goals. He feels comfortable with continuing his exercises at home and is aware of in which areas he requires further exercises/stretching. pt scored 81 on FOTO, but feels he  is closer to 10% impaired, rather than 19%. OT Plan: Discharge   Goals Short Term Goals Short Term Goal 2: Patient will improve PROM in his right shoulder to [facility] for increased ability to reach shelving at work.  Short Term Goal 2 Progress: Met Short Term Goal 5: Patient will decrease fascial restrictions to min-mod in his right shoulder region. Short Term Goal 5 Progress: Met Long Term Goals Long Term Goal 1: Patient will return to prior level of independence with all B/IADLS, work, and leisure activities, using his right arm as dominant.  Long Term Goal 1 Progress: Progressing toward goal Long Term Goal 2: Patient will improve AROM in his right shoulder to [facility] for increased ability to reach shelving at work.  Long Term Goal 2 Progress: Met Long Term Goal 3: Patient will increase right shoulder strength to 5/5 for increased abilty to lift items overhead at work.  Long Term Goal 3 Progress: Met Long Term Goal 4: Patient will decrease pain in his right shoulder to 1/10 when rolling over in bed. Long Term Goal 4 Progress: Progressing toward goal Long Term Goal 5: Patient will decrease fascial restrictions to minimal in his right shoulder region. Long Term Goal 5 Progress: Met  Problem List Patient Active Problem List   Diagnosis Date Noted  . Pain in joint, shoulder region 07/26/2013  . Muscle weakness (generalized) 07/26/2013  . Capsulitis, adhesive shoulder 07/14/2013    End of Session Activity Tolerance: Patient tolerated treatment well General Behavior During Therapy: [facility] for tasks assessed/performed  GO   Bea Graff, MS, OTR/L [phone]  11/09/2013, 4:29 PM  Physician Documentation Your signature is required to indicate approval of the treatment plan as stated  above.  Please sign and either send electronically or make a copy of this report for your files and return this physician signed original.  Please mark one 1.__approve of plan  2. ___approve of plan with the  following conditions.   ______________________________                                                          _____________________ Physician Signature                                                                                                             Date

## 2014-01-11 ENCOUNTER — Ambulatory Visit (INDEPENDENT_AMBULATORY_CARE_PROVIDER_SITE_OTHER): Payer: BC Managed Care – PPO | Admitting: Orthopedic Surgery

## 2014-01-11 VITALS — BP 155/103 | Ht 65.0 in | Wt 220.0 lb

## 2014-01-11 DIAGNOSIS — M7500 Adhesive capsulitis of unspecified shoulder: Secondary | ICD-10-CM

## 2014-01-11 NOTE — Patient Instructions (Signed)
Continue Aleve and exercises

## 2014-01-11 NOTE — Progress Notes (Signed)
Patient ID: Michael Hester, male   DOB: [birth year], 48 y.o.   MRN: [id number] Chief Complaint  Patient presents with  . Follow-up    3 month recheck right shoulder   Recheck right shoulder for stiffness after these capsulitis. Treated with physical therapy did well minimal discomfort relieved with Aleve 3 times a day  Review of systems bilateral upper shoulder pain along the trapezius muscles  Patient's right shoulder has regained range of motion it is stable strength is normal bilaterally scans intact there is no tenderness except in the trapezius. Mood is normal is oriented x3 has normal vital signs BP 155/103  Ht 5\' 5"  (1.651 m)  Wt 220 lb (99.791 kg)  BMI 36.61 kg/m2  Adhesive capsulitis resolved continue home exercises followup as needed

## 2016-07-29 ENCOUNTER — Emergency Department (HOSPITAL_COMMUNITY): Payer: BLUE CROSS/BLUE SHIELD

## 2016-07-29 ENCOUNTER — Encounter (HOSPITAL_COMMUNITY): Payer: Self-pay | Admitting: Emergency Medicine

## 2016-07-29 ENCOUNTER — Emergency Department (HOSPITAL_COMMUNITY)
Admission: EM | Admit: 2016-07-29 | Discharge: 2016-07-29 | Disposition: A | Discharge status: Home / Self Care | Payer: BLUE CROSS/BLUE SHIELD | Attending: Emergency Medicine | Admitting: Emergency Medicine

## 2016-07-29 DIAGNOSIS — Z79899 Other long term (current) drug therapy: Secondary | ICD-10-CM | POA: Diagnosis not present

## 2016-07-29 DIAGNOSIS — R42 Dizziness and giddiness: Secondary | ICD-10-CM

## 2016-07-29 DIAGNOSIS — Z7982 Long term (current) use of aspirin: Secondary | ICD-10-CM | POA: Insufficient documentation

## 2016-07-29 DIAGNOSIS — E876 Hypokalemia: Secondary | ICD-10-CM | POA: Diagnosis not present

## 2016-07-29 DIAGNOSIS — I10 Essential (primary) hypertension: Secondary | ICD-10-CM | POA: Diagnosis not present

## 2016-07-29 HISTORY — DX: Essential (primary) hypertension: I10

## 2016-07-29 LAB — URINALYSIS, ROUTINE W REFLEX MICROSCOPIC
BILIRUBIN URINE: NEGATIVE
Glucose, UA: NEGATIVE mg/dL
Hgb urine dipstick: NEGATIVE
KETONES UR: NEGATIVE mg/dL
Leukocytes, UA: NEGATIVE
NITRITE: NEGATIVE
Protein, ur: NEGATIVE mg/dL
Specific Gravity, Urine: 1.015 (ref 1.005–1.030)
pH: 7 (ref 5.0–8.0)

## 2016-07-29 LAB — COMPREHENSIVE METABOLIC PANEL
ALK PHOS: 66 U/L (ref 38–126)
ALT: 36 U/L (ref 17–63)
ANION GAP: 11 (ref 5–15)
AST: 28 U/L (ref 15–41)
Albumin: 4.3 g/dL (ref 3.5–5.0)
BILIRUBIN TOTAL: 0.9 mg/dL (ref 0.3–1.2)
BUN: 16 mg/dL (ref 6–20)
CALCIUM: 9.2 mg/dL (ref 8.9–10.3)
CO2: 25 mmol/L (ref 22–32)
Chloride: 100 mmol/L — ABNORMAL LOW (ref 101–111)
Creatinine, Ser: 1.02 mg/dL (ref 0.61–1.24)
GFR calc non Af Amer: >60 mL/min (ref >60)
Glucose, Bld: 99 mg/dL (ref 65–99)
Potassium: 3.2 mmol/L — ABNORMAL LOW (ref 3.5–5.1)
SODIUM: 136 mmol/L (ref 135–145)
TOTAL PROTEIN: 7.7 g/dL (ref 6.5–8.1)

## 2016-07-29 LAB — CBC WITH DIFFERENTIAL/PLATELET
Basophils Absolute: 0 10*3/uL (ref 0.0–0.1)
Basophils Relative: 0 %
EOS ABS: 0.1 10*3/uL (ref 0.0–0.7)
Eosinophils Relative: 1 %
HEMATOCRIT: 44.2 % (ref 39.0–52.0)
HEMOGLOBIN: 15.3 g/dL (ref 13.0–17.0)
LYMPHS ABS: 2 10*3/uL (ref 0.7–4.0)
Lymphocytes Relative: 20 %
MCH: 30.4 pg (ref 26.0–34.0)
MCHC: 34.6 g/dL (ref 30.0–36.0)
MCV: 87.7 fL (ref 78.0–100.0)
MONOS PCT: 6 %
Monocytes Absolute: 0.6 10*3/uL (ref 0.1–1.0)
NEUTROS PCT: 73 %
Neutro Abs: 7.2 10*3/uL (ref 1.7–7.7)
Platelets: 271 10*3/uL (ref 150–400)
RBC: 5.04 MIL/uL (ref 4.22–5.81)
RDW: 14.2 % (ref 11.5–15.5)
WBC: 9.8 10*3/uL (ref 4.0–10.5)

## 2016-07-29 LAB — TROPONIN I

## 2016-07-29 MED ORDER — POTASSIUM CHLORIDE CRYS ER 20 MEQ PO TBCR
40.0000 meq | EXTENDED_RELEASE_TABLET | Freq: Once | ORAL | Status: AC
  Administered 2016-07-29: 40 meq via ORAL
  Filled 2016-07-29: qty 2

## 2016-07-29 MED ORDER — SODIUM CHLORIDE 0.9 % IV SOLN
1000.0000 mL | Freq: Once | INTRAVENOUS | Status: AC
Start: 2016-07-29 — End: 2016-07-29
  Administered 2016-07-29: 1000 mL via INTRAVENOUS

## 2016-07-29 NOTE — ED Notes (Signed)
Ambulated in hall without difficulty.  Walked around nurses station and service hall.  No c/o pain, SOB or discomfort.

## 2016-07-29 NOTE — ED Notes (Signed)
Patient transported to X-ray 

## 2016-07-29 NOTE — ED Provider Notes (Signed)
[facility]-EM[facility]Y DEPT Provider Note   CSN: [id number] Arrival date & time: 07/29/16  0807     History   Chief Complaint Chief Complaint  Patient presents with  . Dizziness    HPI Michael Hester is a 50 y.o. male.  The history is provided by the patient. No language interpreter was used.  Dizziness  Quality:  Unable to specify Severity:  Moderate Onset quality:  Gradual Duration:  2 weeks Timing:  Intermittent Progression:  Worsening Chronicity:  New Context: not with loss of consciousness   Relieved by:  Being still Worsened by:  Nothing Ineffective treatments:  None tried Associated symptoms: nausea   Risk factors: no anemia   Pt complains of feeling tired when walking.  Pt reports he lays down and he feels good.  Pt reports 2 episodes of diarrhea.  No abdominal pain.  No fever, no cough, no chest pain.    Past Medical History:  Diagnosis Date  . Hypertension     Patient Active Problem List   Diagnosis Date Noted  . Pain in joint, shoulder region 07/26/2013  . Muscle weakness (generalized) 07/26/2013  . Capsulitis, adhesive shoulder 07/14/2013    Past Surgical History:  Procedure Laterality Date  . CYST EXCISION PERINEAL         Home Medications    Prior to Admission medications   Medication Sig Start Date End Date Taking? Authorizing Provider  aspirin EC 81 MG tablet Take 81 mg by mouth every 6 (six) hours as needed.   Yes Historical Provider, MD  lisinopril-hydrochlorothiazide (PRINZIDE,ZESTORETIC) 20-12.5 MG tablet Take 1 tablet by mouth daily.   Yes Historical Provider, MD    Family History No family history on file.  Social History Social History  Substance Use Topics  . Smoking status: Not on file  . Smokeless tobacco: Not on file  . Alcohol use Not on file     Allergies   Review of patient's allergies indicates no known allergies.   Review of Systems Review of Systems  Gastrointestinal: Positive for nausea.  Neurological:  Positive for dizziness.  All other systems reviewed and are negative.    Physical Exam Updated Vital Signs BP 131/95   Pulse 77   Temp 98.5 F (36.9 C) (Oral)   Resp 11   Ht 5\' 5"  (1.651 m)   Wt 97.5 kg   SpO2 100%   BMI 35.78 kg/m   Physical Exam  Constitutional: He appears well-developed and well-nourished.  HENT:  Head: Normocephalic and atraumatic.  Eyes: Conjunctivae are normal.  Neck: Neck supple.  Cardiovascular: Normal rate and regular rhythm.   No murmur heard. Pulmonary/Chest: Effort normal and breath sounds normal. No respiratory distress.  Abdominal: Soft. There is no tenderness.  Musculoskeletal: He exhibits no edema.  Neurological: He is alert.  Skin: Skin is warm and dry.  Psychiatric: He has a normal mood and affect.  Nursing note and vitals reviewed.    ED Treatments / Results  Labs (all labs ordered are listed, but only abnormal results are displayed) Labs Reviewed  COMPREHENSIVE METABOLIC PANEL - Abnormal; Notable for the following:       Result Value   Potassium 3.2 (*)    Chloride 100 (*)    All other components within normal limits  CBC WITH DIFFERENTIAL/PLATELET  TROPONIN I  URINALYSIS, ROUTINE W REFLEX MICROSCOPIC (NOT AT [facility])  TROPONIN I    EKG  EKG Interpretation  Date/Time:  Monday July 29 2016 11:01:43 EDT Ventricular Rate:  94 PR Interval:    QRS Duration: 92 QT Interval:  352 QTC Calculation: 441 R Axis:   -26 Text Interpretation:  Sinus rhythm Borderline left axis deviation nonspecific T wave changes compared to earlier ecg Confirmed by CAMPOS  MD, Caryn Bee ([id number]) on 07/29/2016 11:05:05 AM       Radiology Dg Chest 2 View  Result Date: 07/29/2016 CLINICAL DATA:  Patient with weakness and fatigue for multiple weeks. Chest tightness. EXAM: CHEST  2 VIEW COMPARISON:  Chest radiograph 03/20/2007. FINDINGS: Monitoring leads overlie the patient. Stable prominent cardiac and mediastinal contours. No consolidative pulmonary  opacities. No pleural effusion or pneumothorax. Mid thoracic spine degenerative changes. IMPRESSION: No active cardiopulmonary disease. Electronically Signed   By: Annia Belt M.D.   On: 07/29/2016 12:00    Procedures Procedures (including critical care time)  Medications Ordered in ED Medications  0.9 %  sodium chloride infusion (0 mLs Intravenous Stopped 07/29/16 0956)  potassium chloride SA (K-DUR,KLOR-CON) CR tablet 40 mEq (40 mEq Oral Given 07/29/16 1011)     Initial Impression / Assessment and Plan / ED Course  I have reviewed the triage vital signs and the nursing notes.  Pertinent labs & imaging results that were available during my care of the patient were reviewed by me and considered in my medical decision making (see chart for details).  Clinical Course    Pt given Iv fluids x 1 liter,   Potassium 40 meq.   Pt advised to follow up with his MD.  Pt advised to see cardiology for evaluation.  Possible stress test to evaluate heart.   Pt evaluated by Dr. Patria Mane.  Pt able to ambulate without difficulty  Final Clinical Impressions(s) / ED Diagnoses   Final diagnoses:  Dizziness  Hypokalemia    New Prescriptions Discharge Medication List as of 07/29/2016  2:03 PM    An After Visit Summary was printed and given to the patient.   Lonia Skinner [place], PA-C 07/29/16 1505    Azalia Bilis, MD 07/29/16 [phone]

## 2016-07-29 NOTE — ED Notes (Signed)
Returned from  X-ray

## 2016-07-29 NOTE — ED Triage Notes (Signed)
Been under a lot of stress lately.  Have not felt like myself for about last 2 weeks.  Pt says, I'm not weak, just dont feel like my normal self.  Has had several loose stools for last 2 weeks, about 2 daily.  Can not pin point any symptoms.  Had flu shot about 2 weeks ago.

## 2016-08-12 NOTE — Progress Notes (Signed)
Cardiology Office Note   Date:  08/14/2016   ID:  Michael Hester, DOB 22-Mar-1966, MRN [id number]  PCP:  Colette Ribas, MD  Cardiologist:   Rollene Rotunda, MD  Referring:  Dr. Patria Mane  Chief Complaint  Patient presents with  . Chest Pain      History of Present Illness: Michael Hester is a 50 y.o. male who presents for evaluation of weakness.  The patient was in the ED in 10/16 with weakness and had had diarrhea.  I reviewed these records.  There did not appear to be any acute cardiac events although possible ischemia as an etiology of tiredness was suggested.  He was hydrated and discharged from the ED.  There was no objective evidence of ischemia.  There was a question of sleep apnea.    He has some right-sided chest discomfort but this is somewhat atypical. He does not have radiation to his jaw or to his arms. He does not have left-sided substernal discomfort. He has no new shortness of breath, PND or orthopnea. He's had no palpitations, presyncope or syncope. He said his biggest complaint has been profound fatigue since his symptoms happened. He's tired when he gets home from work. He doesn't want to do anything. He does snore although there has been no witnessed apnea. He's not had any palpitations, presyncope or syncope. He's had a gradual weight gain but no edema.  Past Medical History:  Diagnosis Date  . Hypertension     Past Surgical History:  Procedure Laterality Date  . CYST EXCISION PERINEAL       Current Outpatient Prescriptions  Medication Sig Dispense Refill  . aspirin EC 81 MG tablet Take 81 mg by mouth every 6 (six) hours as needed.    Marland Kitchen lisinopril-hydrochlorothiazide (PRINZIDE,ZESTORETIC) 20-12.5 MG tablet Take 1 tablet by mouth daily.     No current facility-administered medications for this visit.     Allergies:   Review of patient's allergies indicates no known allergies.    Social History:  The patient  reports that he has never smoked. He  has never used smokeless tobacco.   Family History:  The patient's family history includes Diabetes in his father; Gout in his mother; Kidney failure in his father.    ROS:  Please see the history of present illness.   Otherwise, review of systems are positive for none.   All other systems are reviewed and negative.    PHYSICAL EXAM: VS:  BP (!) 142/86   Pulse (!) 107   Ht 5\' 5"  (1.651 m)   Wt 213 lb (96.6 kg)   SpO2 97%   BMI 35.45 kg/m  , BMI Body mass index is 35.45 kg/m. GENERAL:  Well appearing HEENT:  Pupils equal round and reactive, fundi not visualized, oral mucosa unremarkable NECK:  No jugular venous distention, waveform within normal limits, carotid upstroke brisk and symmetric, no bruits, no thyromegaly LYMPHATICS:  No cervical, inguinal adenopathy LUNGS:  Clear to auscultation bilaterally BACK:  No CVA tenderness CHEST:  Unremarkable HEART:  PMI not displaced or sustained,S1 and S2 within normal limits, no S3, no S4, no clicks, no rubs, no murmurs ABD:  Flat, positive bowel sounds normal in frequency in pitch, no bruits, no rebound, no guarding, no midline pulsatile mass, no hepatomegaly, no splenomegaly EXT:  2 plus pulses throughout, no edema, no cyanosis no clubbing SKIN:  No rashes no nodules NEURO:  Cranial nerves II through XII grossly intact, motor grossly intact throughout  PSYCH:  Cognitively intact, oriented to person place and time    EKG:  EKG is not ordered today. The ekg ordered 07/29/16 demonstrates sinus rhythm, rate 94, axis within normal limits, intervals within normal limits, no acute ST-T wave changes.   Recent Labs: 07/29/2016: ALT 36; BUN 16; Creatinine, Ser 1.02; Hemoglobin 15.3; Platelets 271; Potassium 3.2; Sodium 136    Lipid Panel No results found for: CHOL, TRIG, HDL, CHOLHDL, VLDL, LDLCALC, LDLDIRECT    Wt Readings from Last 3 Encounters:  08/13/16 213 lb (96.6 kg)  07/29/16 215 lb (97.5 kg)  01/11/14 220 lb (99.8 kg)       Other studies Reviewed: Additional studies/ records that were reviewed today include: ED records. . Review of the above records demonstrates:  Please see elsewhere in the note.     ASSESSMENT AND PLAN:  CHEST PAIN:  This is very atypical. He does not have significant cardiovascular risk factors. I think the pretest probability of obstructive coronary disease is very low. At this point no further testing is planned.  FATIGUE:  This will be evaluated as below. I'll check a TSH.  HYPOKALEMIA:  This was probably related to diarrhea. He's not had labs since the ER. I will check a basic metabolic profile     QUESTIONABLE SLEEP APNEA:    The patient certainly could have this is an etiology of his most pressing complaint fatigue. He'll be set up for a sleep study.  Current medicines are reviewed at length with the patient today.  The patient does not have concerns regarding medicines.  The following changes have been made:  no change  Labs/ tests ordered today include:   Orders Placed This Encounter  Procedures  . Basic metabolic panel  . TSH  . Ambulatory referral to Sleep Studies     Disposition:   FU with me as needed.      Signed, Rollene Rotunda, MD  08/14/2016 7:59 PM    [facility]

## 2016-08-13 ENCOUNTER — Ambulatory Visit (INDEPENDENT_AMBULATORY_CARE_PROVIDER_SITE_OTHER): Payer: BLUE CROSS/BLUE SHIELD | Admitting: Cardiology

## 2016-08-13 ENCOUNTER — Encounter: Payer: Self-pay | Admitting: Cardiology

## 2016-08-13 VITALS — BP 142/86 | HR 107 | Ht 65.0 in | Wt 213.0 lb

## 2016-08-13 DIAGNOSIS — R0789 Other chest pain: Secondary | ICD-10-CM | POA: Diagnosis not present

## 2016-08-13 DIAGNOSIS — R5383 Other fatigue: Secondary | ICD-10-CM | POA: Diagnosis not present

## 2016-08-13 DIAGNOSIS — R0683 Snoring: Secondary | ICD-10-CM | POA: Diagnosis not present

## 2016-08-13 NOTE — Patient Instructions (Addendum)
Medication Instructions:  Continue all current medications.  Labwork:  BMET, TSH - orders given today.  Office will contact with results via phone or letter.    Testing/Procedures: Sleep study - referred to [facility] [place]  Follow-Up: As needed   Any Other Special Instructions Will Be Listed Below (If Applicable).  If you need a refill on your cardiac medications before your next appointment, please call your pharmacy.

## 2016-08-14 ENCOUNTER — Encounter: Payer: Self-pay | Admitting: Cardiology

## 2016-08-15 ENCOUNTER — Encounter: Payer: Self-pay | Admitting: Cardiology

## 2016-08-30 ENCOUNTER — Encounter: Payer: Self-pay | Admitting: *Deleted

## 2016-09-09 ENCOUNTER — Ambulatory Visit (INDEPENDENT_AMBULATORY_CARE_PROVIDER_SITE_OTHER): Payer: BLUE CROSS/BLUE SHIELD | Admitting: Neurology

## 2016-09-09 ENCOUNTER — Encounter: Payer: Self-pay | Admitting: Neurology

## 2016-09-09 VITALS — BP 136/88 | HR 72 | Resp 20 | Ht 65.0 in | Wt 222.0 lb

## 2016-09-09 DIAGNOSIS — R002 Palpitations: Secondary | ICD-10-CM | POA: Diagnosis not present

## 2016-09-09 DIAGNOSIS — R0683 Snoring: Secondary | ICD-10-CM | POA: Diagnosis not present

## 2016-09-09 DIAGNOSIS — R253 Fasciculation: Secondary | ICD-10-CM

## 2016-09-09 DIAGNOSIS — I200 Unstable angina: Secondary | ICD-10-CM | POA: Diagnosis not present

## 2016-09-09 DIAGNOSIS — G4733 Obstructive sleep apnea (adult) (pediatric): Secondary | ICD-10-CM | POA: Insufficient documentation

## 2016-09-09 NOTE — Progress Notes (Addendum)
SLEEP MEDICINE CLINIC   Provider:  Melvyn Novas, M D  Referring Provider:  Rollene Rotunda, MD   Primary Care Physician:  Colette Ribas, MD  Chief Complaint  Patient presents with  . New Patient (Initial Visit)    snoring, fatigue, never had a sleep study    HPI:  Michael Hester is a 50 y.o. male , seen here as a referral from Dr. Antoine Poche, MD ( cardiology),  for a sleep evaluation,  [name] has a history of Obesity, Snoring , Hypertension ( well controlled on 2 substances, lisinopril and hydrochlorothiazide), and he takes aspirin as needed.  He was hospitalized on 10-16 after being dehydrated due to diarrhea, he had also hyperkalemia, irregular heartbeats, palpitations, twitching. He felt extremely tired. He was discharged after hydration but felt the need to rest, he felt  better when lying down, needed this for 14 days. Has had Cardiac stress tests, all negative. His chest discomfort was considered atypical without radiation to jaw or arms, he has no shortness of breath, no orthopnea, no presyncope or syncope. It was just a profound fatigue. He is tired when he gets home from work,  he doesn't have the motivation or energy to do anything extracurricular.  Mrs. Reznick has been bothered by her husband's loud snoring, but he states that he usually wakes up rested and restored and that snoring has not disturbed him in his sleep.  Sleep habits are as follows: His usual bedtime is between 10 and 11 PM, and he usually falls asleep without difficulty. He shares a bedroom with his wife, the environment is cool, quiet and dark. He prefers to sleep on his stomach. His wife is fascinated by his snoring by being prone. He frequently dreams but the dreams are not nightmarish. Once a night he may go to the bathroom, it is the urge to urinate that wakes him. He denies being woken by diaphoresis, palpitations, pain or shortness of breath. He may have a dry mouth in the morning. He rises at 5  AM, usually after 6 hours of sleep, and usually in refreshed and restored state. He denies headaches, dysesthesias, visual disturbances or orthostasis.   Sleep medical history and family sleep history: He does not recall any sleep disorder as a child, he has always been a good sleeper, no complaint of excessive daytime sleepiness or insomnia. He is unaware of any family member being affected by a sleep disorder. He never underwent tonsillectomy adenoidectomy or septal nasal surgery.  Social history: married, father of 4, 3 sons, 81, twins 1 and a daughter 67 years old.  Non smoker, non ETOH drinker, caffeine use rarely.   Review of Systems: Out of a complete 14 system review, the patient complains of only the following symptoms, and all other reviewed systems are negative.  Snoring, weight gain, headaches when driving long distance.  Epworth score  3 , Fatigue severity score 23  , depression score 2/15    Social History   Social History  . Marital status: Married    Spouse name: N/A  . Number of children: 4  . Years of education: N/A   Occupational History  . [employer]     Works in a [employer]   Social History Main Topics  . Smoking status: Never Smoker  . Smokeless tobacco: Never Used  . Alcohol use Not on file  . Drug use: Unknown  . Sexual activity: Not on file   Other Topics Concern  . Not  on file   Social History Narrative   Lives with wife.      Family History  Problem Relation Age of Onset  . Gout Mother   . Diabetes Father   . Kidney failure Father     Past Medical History:  Diagnosis Date  . Hypertension     Past Surgical History:  Procedure Laterality Date  . CYST EXCISION PERINEAL      Current Outpatient Prescriptions  Medication Sig Dispense Refill  . aspirin EC 81 MG tablet Take 81 mg by mouth every 6 (six) hours as needed.    Marland Kitchen lisinopril-hydrochlorothiazide (PRINZIDE,ZESTORETIC) 20-12.5 MG tablet Take 1 tablet by mouth daily.      No current facility-administered medications for this visit.     Allergies as of 09/09/2016  . (No Known Allergies)    Vitals: BP 136/88   Pulse 72   Resp 20   Ht 5\' 5"  (1.651 m)   Wt 222 lb (100.7 kg)   BMI 36.94 kg/m  Last Weight:  Wt Readings from Last 1 Encounters:  09/09/16 222 lb (100.7 kg)   09/11/16 mass index is 36.94 kg/m.     Last Height:   Ht Readings from Last 1 Encounters:  09/09/16 5\' 5"  (1.651 m)    Physical exam:  General: The patient is awake, alert and appears not in acute distress. The patient is well groomed. Head: Normocephalic, atraumatic. Neck is supple. Mallampati 3,  neck circumference:18. Nasal airflow congested, Retrognathia is seen.  Cardiovascular:  Regular rate and rhythm, without  murmurs or carotid bruit, and without distended neck veins. Respiratory: Lungs are clear to auscultation. Skin:  Without evidence of edema, or rash Trunk: BMI is elevated 37. The patient's posture is erect   Neurologic exam : The patient is awake and alert, oriented to place and time.   Memory subjective described as intact.  Attention span & concentration ability appears normal.  Speech is fluent,  without dysarthria, dysphonia or aphasia.  Mood and affect are appropriate.  Cranial nerves: Pupils are equal and briskly reactive to light.  Extraocular movements in vertical and horizontal planes intact and without nystagmus. Visual fields by finger perimetry are intact. Hearing to finger rub intact. Facial sensation intact to fine touch.  Facial motor strength is symmetric and tongue and uvula move midline. Shoulder shrug was symmetrical.   Motor exam:  Normal tone, muscle bulk and symmetric strength in all extremities. Sensory:  Fine touch, pinprick and vibration were tested in all extremities. Proprioception tested in the upper extremities was normal. Coordination: Rapid alternating movements in the fingers/hands was normal. Finger-to-nose maneuver  normal  without evidence of ataxia, dysmetria or tremor. Gait and station: Patient walks without assistive device and is able unassisted to climb up to the exam table. Strength within normal limits.  Stance is stable and normal.   Deep tendon reflexes: in the  upper and lower extremities are symmetric and intact.   The patient was advised of the nature of the diagnosed sleep disorder , the treatment options and risks for general a health and wellness arising from not treating the condition.  I spent more than  40 minutes of face to face time with the patient. Greater than 50% of time was spent in counseling and coordination of care. We have discussed the diagnosis and differential and I answered the patient's questions.     Assessment:  After physical and neurologic examination, review of laboratory studies,  Personal review of imaging studies, reports  of other /same  Imaging studies ,  Results of polysomnography/ neurophysiology testing and pre-existing records as far as provided in visit., my assessment is   1) Mr. Ferreras has experienced thus far unexplained right-sided chest pain that arises without any warning but he drives, while at work, and even while at rest. The chest pain passes when he tries to relax and lays down. A cardiac reason has not been found, and his workup included stress test and various laboratory tests. This kind of angina is associated with increased anxiety of course. One of the reasons for his evaluation is also to see if he has hypoxemia, nocturnal tachycardia to a degree that may not wake him.  2) Mr. Hepburn also experienced severe dehydration with hypokalemia leading to an ER visit. The diarrhea still was present on and off for about 14 days and was very exhausting. From this he has recovered and his fatigue has markedly improved to over the last couple of weeks.  3) as to his possible underlying condition of sleep apnea, he is at higher risk because of a larger than usual neck  circumference, high-grade Mallampati, elevated body mass index and because he is clinically known to snore loudly. He also has mild retrograde via which will foster snoring and is a risk factor for OSA. I would like for him to have an attended sleep study and if we find sleep apnea to a degree of more than 15 per hour I would want him to be put on CPAP. If he uses a split-night protocol we can treat and diagnose the same night. Should Mr. Prosise only snore and have no significant apnea, he may be best treated with a dental device and with the very best treatment of all, weight loss.    Plan:  Treatment plan and additional workup : SPLIT night      Porfirio Mylar Ryota Treece MD  09/09/2016   CC: Assunta Found, Md [address] [place], [place] [address]

## 2016-09-10 ENCOUNTER — Telehealth: Payer: Self-pay | Admitting: Cardiology

## 2016-09-10 NOTE — Telephone Encounter (Signed)
Requested x 2 without results.  Placed call to customer service & they are faxing results this evening.

## 2016-09-10 NOTE — Telephone Encounter (Signed)
Michael Hester called wanting to know if we have test results back from his lab work that was done the beginning of November

## 2016-09-12 NOTE — Telephone Encounter (Signed)
Patient notified all labs stable.  Copy to pmd.

## 2016-09-29 ENCOUNTER — Ambulatory Visit (INDEPENDENT_AMBULATORY_CARE_PROVIDER_SITE_OTHER): Payer: BLUE CROSS/BLUE SHIELD | Admitting: Neurology

## 2016-09-29 DIAGNOSIS — G4733 Obstructive sleep apnea (adult) (pediatric): Secondary | ICD-10-CM | POA: Diagnosis not present

## 2016-09-29 DIAGNOSIS — R253 Fasciculation: Secondary | ICD-10-CM

## 2016-09-29 DIAGNOSIS — R0683 Snoring: Secondary | ICD-10-CM

## 2016-09-29 DIAGNOSIS — I200 Unstable angina: Secondary | ICD-10-CM

## 2016-09-29 DIAGNOSIS — R002 Palpitations: Secondary | ICD-10-CM

## 2016-10-09 ENCOUNTER — Telehealth: Payer: Self-pay | Admitting: Neurology

## 2016-10-09 DIAGNOSIS — G4733 Obstructive sleep apnea (adult) (pediatric): Secondary | ICD-10-CM

## 2016-10-09 NOTE — Telephone Encounter (Signed)
1. Use of CPAP with a Simplus mask by [employer] , size to be fitted, is recommended. CPAP was used with heated humidity during this study and will be ordered as auto set between 8 and 14 cm water.  Advise to add heated humidity.  Adjust interface and heated humidity as needed.  2. Advise to lose weight, diet and exercise if not contraindicated (BMI 37.1) 3. Further information regarding OSA may be obtained from [employer] (www.sleepfoundation.org) or American Sleep Apnea Association (www.sleepapnea.org).

## 2016-10-09 NOTE — Procedures (Signed)
PATIENT'S NAME:  Michael Hester, Michael Hester DOB:      [birth year]      MR#:    [id number]     DATE OF RECORDING: 09/29/2016 REFERRING M.D.:  Assunta Found, MD Study Performed:  Split-Night Titration Study HISTORY:  [name] has a history of Obesity, Snoring, Hypertension ( well controlled on 2 substances, lisinopril and hydrochlorothiazide), and he takes aspirin as needed.  He was hospitalized on 07-29-16 after being dehydrated due to diarrhea, he had also hyperkalemia, irregular heartbeats, palpitations, and muscle twitching and remained extremely tired after he was discharged following rehydration . He was extremely tired and reported chest tightness, angina for about  3-4 weeks, needed  to rest, felt better when lying down, Has had Cardiac stress tests, all negative. His chest discomfort was considered atypical without radiation to jaw or arms, he has no shortness of breath, no orthopnea, no pre-syncope. It was just a profound fatigue. He sleeps well, and likes prone sleep.  He still is tired when he gets home from work, and lacks the motivation or energy to do anything extracurricular. The patient endorsed the Epworth Sleepiness Scale at 3/24 points, the FSS at 23 points.   The patient's weight 222 pounds with a height of 65 (inches), resulting in a BMI of 37.1 kg/m2. The patient's neck circumference measured 18 inches.  CURRENT MEDICATIONS: Aspirin and Lisinopril-Hydrochlorothiazide   PROCEDURE:  This is a multichannel digital polysomnogram utilizing the Somnostar 11.2 system.  Electrodes and sensors were applied and monitored per AASM Specifications.   EEG, EOG, Chin and Limb EMG, were sampled at 200 Hz.  ECG, Snore and Nasal Pressure, Thermal Airflow, Respiratory Effort, CPAP Flow and Pressure, Oximetry was sampled at 50 Hz. Digital video and audio were recorded.      BASELINE STUDY WITHOUT CPAP RESULTS:  Lights Out was at 01:30 and Lights On at 08:17. Total recording time (TRT) was 127.5, with a  total sleep time (TST) of 95.5 minutes.  The patient's sleep latency was 6.5 minutes.  REM void.  The sleep efficiency was 74.9 %.   SLEEP ARCHITECTURE: WASO (Wake after sleep onset) was 11 minutes, Stage N1 was 3 minutes, Stage N2 was 92.5 minutes, Stage N3 was 0 minutes and Stage R (REM sleep) was 0 minutes.  The percentages were Stage N1 3.1%, Stage N2 96.9%, Stage N3 0% and Stage R (REM sleep) 0%.   RESPIRATORY ANALYSIS:  There were a total of 63 respiratory events:  11 obstructive apneas, 0 central apneas and  52 hypopneas with a hypopnea index of 32.7. The patient also had 0 respiratory event related arousals (RERAs). Snoring was noted.     The total APNEA/HYPOPNEA INDEX (AHI) was 39.6 /hour and the total RESPIRATORY DISTURBANCE INDEX was 39.6 /hour.  There was no REM sleep and 104 events in NREM. The REM AHI was 0, /hour versus a non-REM AHI of 39.6 /hour. The patient spent 4 minutes sleep time in the supine position 285 minutes in non-supine. The supine AHI was 45.0 /hour versus a non-supine AHI of 39.3 /hour.  OXYGEN SATURATION & C02:  The wake baseline 02 saturation was 92%, with the lowest being 81%. Time spent below 89% saturation equaled 15 minutes. The arousals were noted as: 6 were spontaneous, 0 were associated with PLMs, and 64 were associated with respiratory events.  PERIODIC LIMB MOVEMENTS:    The patient had a total of 0 Periodic Limb Movements.   Audio and video analysis did not show any abnormal  or unusual movements, behaviors, phonations or vocalizations. Snoring was noted .EKG was in keeping with normal sinus rhythm (NSR)  TITRATION STUDY WITH CPAP RESULTS:   CPAP was initiated at 5 cmH20 with heated humidity per AASM split night standards and pressure was advanced to 10/10 cmH20 because of hypopneas, apneas and desaturations.  At a PAP pressure of 10 cmH20, there was a reduction of the AHI to 2.1 /hour.   Total recording time (TRT) was 280 minutes, with a total sleep time  (TST) of 193 minutes. The patient's sleep latency was 46 minutes. REM latency was 61 minutes.  The sleep efficiency was 68.9 %.    SLEEP ARCHITECTURE: Wake after sleep was 46 minutes, Stage N1 10.5 minutes, Stage N2 140.5 minutes, Stage N3 0 minutes and Stage R (REM sleep) 42 minutes. The percentages were: Stage N1 5.4%, Stage N2 72.8%, Stage N3 0% and Stage R (REM sleep) 21.8%. The sleep architecture was notable for REM sleep rebound   RESPIRATORY ANALYSIS:  There were a total of 32 respiratory events: 0 apneas and 32 hypopneas with 0 respiratory event related arousals (RERAs).     The total APNEA/HYPOPNEA INDEX (AHI) was 9.9 /hour and the total RESPIRATORY DISTURBANCE INDEX was 9.9 /hour. 1 event occurred in REM sleep and 31 events in NREM.  The REM AHI was 1.4 /hour versus a non-REM AHI of 12.3 /hour. REM sleep was achieved on a pressure of  8 cm/ H20 (AHI was 0.7) The patient spent 0% of total sleep time in the supine position. The supine AHI was 0.0 /hour, versus a non-supine AHI of 9.9 /hour.  OXYGEN SATURATION & C02:  The wake baseline 02 saturation was 94%, with the lowest being 94%. Time spent below 89% saturation equaled 0 minutes.  The patient had a total of 0 Periodic Limb Movements. The arousals were noted as: 14 were spontaneous, 0 were associated with PLMs, 32 were associated with respiratory events.   POLYSOMNOGRAPHY IMPRESSION :   1. Obstructive Sleep Apnea(OSA) , severe with an AHI of 39.8 / hr. Titrated to 10 cm water with a reduction in Ahi and improved Sp02 . No PLMs.  2.  Sleep efficiency was poor due to the patient's preference for prone sleep position which made use of CPAP interface cumber some.    RECOMMENDATIONS:  1. Use of CPAP with a Simplus mask by [employer] in unknown size is recommended. CPAP was used with heated humidity during this study and will be ordered as auto set between 8 and 14 cm water.  Advise to add heated humidity.  Adjust interface and  heated humidity as needed.     2. Compliance to PAP therapy should be emphasized as 4 hours or more of nightly use.  Compliance, AHI and air leak information to be downloaded for objective assessment at 30 days, 180 days and annually thereafter.   3. Advise to lose weight, diet and exercise if not contraindicated (BMI 37.1) 4. Further information regarding OSA may be obtained from [employer] (www.sleepfoundation.org) or American Sleep Apnea Association (www.sleepapnea.org). 5. A follow up appointment will be scheduled in the Sleep Clinic at [facility].      I certify that I have reviewed the entire raw data recording prior to the issuance of this report in accordance with the Standards of Accreditation of the American Academy of Sleep Medicine (AASM)      Melvyn Novas, M.D.  10-10-2016  Diplomat, American Board of Psychiatry and Neurology  Diplomat, Biomedical engineer of Sleep Medicine Wellsite geologist, [employer] [facility] [employer]

## 2016-10-10 NOTE — Telephone Encounter (Signed)
-----   Message from Melvyn Novas, MD sent at 10/09/2016  3:25 PM EST ----- Clearly severe apnea, patient slept on his side and not prone ( probably because he couldn't sleep prone easily in the lab) . He was titrated to CPAP and AHI was reduced at 10 cm water, but not fully titrated. I ordered CPAP airsense or dream station with a F and P interface , SIMPLUS, 8 through 14 cm water, with 2 cm EPR . CD

## 2016-10-10 NOTE — Telephone Encounter (Signed)
LM for patient to call back.

## 2016-10-10 NOTE — Telephone Encounter (Signed)
Patient called back. He is aware of results and recommendations. He is willing to start treatment. I will send report to PCP. Patient will get a letter reminding him to make f/u appt and stress the importance of compliance.

## 2016-11-08 ENCOUNTER — Telehealth: Payer: Self-pay | Admitting: Neurology

## 2016-11-08 NOTE — Telephone Encounter (Signed)
Pt called [phone]) [phone] said his ins changed to [facility] 10/14/16. [facility] does not accept it. He said [facility]  [phone] does accept and is requesting RX for CPAP sent to them.

## 2016-11-08 NOTE — Telephone Encounter (Signed)
Baxter Hire, please change to [facility] for DME. CD

## 2016-11-11 NOTE — Telephone Encounter (Signed)
I do not have pt's new insurance info nor a follow up appt with Dr. Vickey Huger for him. Pt will need to provide [facility] with his new insurance information and make a follow up appt with Dr. Vickey Huger. Referral sent to [facility] pharmacy.  I called pt to discuss. No answer, left a message asking him to call me back.

## 2016-11-11 NOTE — Telephone Encounter (Signed)
Patient called and I gave him information as stated in below message.

## 2016-11-12 NOTE — Telephone Encounter (Signed)
I called pt. I advised him that [facility] pharmacy does not accept [employer], but that [facility] does. Pt is agreeable to me reaching out to [facility] and asking them to call him to discuss. I will email [facility] and ask them to call the pt. Pt verbalized understanding.

## 2016-11-12 NOTE — Telephone Encounter (Signed)
Onalee Hua with [facility] pharmacy called to let us know they checked patient [employer] and they do not accept it and will not be able to do his CPAP there. Call back number [phone].

## 2016-11-18 NOTE — Telephone Encounter (Signed)
Megan with [facility] is calling for the patient. Please call the patient regarding his CPAP.  I advised of below message but she says the patient is confused and needs a call back. He can be reached at [phone].

## 2016-11-19 NOTE — Telephone Encounter (Signed)
I spoke to patient and he is asking the status of his CPAP order. I advised him that it has been sent to [facility], [facility] does not accept his insurance. Patient insists that [facility] does not accept his [employer], which they do and have confirmed with [place] that they do.  Since patient lives in [place] I advised him that I can send to [facility]. They accept [employer]. Patient states that he is fine with this. They will need updated insurance card.  Order sent to [facility] now.

## 2019-10-18 ENCOUNTER — Other Ambulatory Visit: Payer: Self-pay

## 2019-10-18 ENCOUNTER — Ambulatory Visit: Payer: Managed Care, Other (non HMO) | Attending: Internal Medicine

## 2019-10-18 DIAGNOSIS — Z20822 Contact with and (suspected) exposure to covid-19: Secondary | ICD-10-CM

## 2019-10-19 LAB — NOVEL CORONAVIRUS, NAA: SARS-CoV-2, NAA: NOT DETECTED

## 2020-01-13 ENCOUNTER — Ambulatory Visit: Payer: Managed Care, Other (non HMO) | Attending: Internal Medicine

## 2020-01-13 DIAGNOSIS — Z23 Encounter for immunization: Secondary | ICD-10-CM

## 2020-01-13 NOTE — Progress Notes (Signed)
   Covid-19 Vaccination Clinic  Name:  Michael Hester    MRN: [id number] DOB: [birth year]  01/13/2020  [name] was observed post Covid-19 immunization for 15 minutes without incident. He was provided with Vaccine Information Sheet and instruction to access the V-Safe system.   [name] was instructed to call 911 with any severe reactions post vaccine: Marland Kitchen Difficulty breathing  . Swelling of face and throat  . A fast heartbeat  . A bad rash all over body  . Dizziness and weakness   Immunizations Administered    Name Date Dose VIS Date Route   Moderna COVID-19 Vaccine 01/13/2020  8:59 AM 0.5 mL 09/14/2019 Intramuscular   Manufacturer: Moderna   Lot: [id number]   NDC: 82518-984-21

## 2020-02-15 ENCOUNTER — Ambulatory Visit: Payer: Managed Care, Other (non HMO)

## 2020-02-16 ENCOUNTER — Ambulatory Visit: Payer: Managed Care, Other (non HMO) | Attending: Internal Medicine

## 2020-02-16 DIAGNOSIS — Z23 Encounter for immunization: Secondary | ICD-10-CM

## 2020-02-16 NOTE — Progress Notes (Signed)
   Covid-19 Vaccination Clinic  Name:  Michael Hester    MRN: [id number] DOB: 1966-01-14  02/16/2020  Mr. Monier was observed post Covid-19 immunization for 15 minutes without incident. He was provided with Vaccine Information Sheet and instruction to access the V-Safe system.   Mr. Pintor was instructed to call 911 with any severe reactions post vaccine: Marland Kitchen Difficulty breathing  . Swelling of face and throat  . A fast heartbeat  . A bad rash all over body  . Dizziness and weakness   Immunizations Administered    Name Date Dose VIS Date Route   Moderna COVID-19 Vaccine 02/16/2020  8:05 AM 0.5 mL 09/2019 Intramuscular   Manufacturer: Moderna   Lot: [id number]   NDC: 93734-287-68

## 2021-09-21 ENCOUNTER — Inpatient Hospital Stay (HOSPITAL_COMMUNITY)
Admission: EM | Admit: 2021-09-21 | Discharge: 2021-09-27 | DRG: 286 | Disposition: A | Discharge status: Home / Self Care | Payer: Managed Care, Other (non HMO) | Attending: Internal Medicine | Admitting: Internal Medicine

## 2021-09-21 ENCOUNTER — Encounter (HOSPITAL_COMMUNITY): Payer: Self-pay | Admitting: *Deleted

## 2021-09-21 ENCOUNTER — Emergency Department (HOSPITAL_COMMUNITY): Payer: Managed Care, Other (non HMO)

## 2021-09-21 DIAGNOSIS — G4733 Obstructive sleep apnea (adult) (pediatric): Secondary | ICD-10-CM

## 2021-09-21 DIAGNOSIS — I272 Pulmonary hypertension, unspecified: Secondary | ICD-10-CM | POA: Diagnosis present

## 2021-09-21 DIAGNOSIS — I428 Other cardiomyopathies: Secondary | ICD-10-CM | POA: Diagnosis present

## 2021-09-21 DIAGNOSIS — I5041 Acute combined systolic (congestive) and diastolic (congestive) heart failure: Secondary | ICD-10-CM | POA: Diagnosis present

## 2021-09-21 DIAGNOSIS — I5082 Biventricular heart failure: Secondary | ICD-10-CM | POA: Diagnosis present

## 2021-09-21 DIAGNOSIS — J90 Pleural effusion, not elsewhere classified: Secondary | ICD-10-CM

## 2021-09-21 DIAGNOSIS — I509 Heart failure, unspecified: Secondary | ICD-10-CM | POA: Diagnosis not present

## 2021-09-21 DIAGNOSIS — E876 Hypokalemia: Secondary | ICD-10-CM | POA: Diagnosis present

## 2021-09-21 DIAGNOSIS — Z6841 Body Mass Index (BMI) 40.0 and over, adult: Secondary | ICD-10-CM

## 2021-09-21 DIAGNOSIS — I2510 Atherosclerotic heart disease of native coronary artery without angina pectoris: Secondary | ICD-10-CM | POA: Diagnosis present

## 2021-09-21 DIAGNOSIS — R0602 Shortness of breath: Secondary | ICD-10-CM | POA: Diagnosis not present

## 2021-09-21 DIAGNOSIS — R000 Tachycardia, unspecified: Secondary | ICD-10-CM | POA: Diagnosis present

## 2021-09-21 DIAGNOSIS — I110 Hypertensive heart disease with heart failure: Secondary | ICD-10-CM | POA: Diagnosis not present

## 2021-09-21 DIAGNOSIS — Z20822 Contact with and (suspected) exposure to covid-19: Secondary | ICD-10-CM | POA: Diagnosis present

## 2021-09-21 DIAGNOSIS — Z8249 Family history of ischemic heart disease and other diseases of the circulatory system: Secondary | ICD-10-CM

## 2021-09-21 DIAGNOSIS — R911 Solitary pulmonary nodule: Secondary | ICD-10-CM | POA: Diagnosis present

## 2021-09-21 DIAGNOSIS — I10 Essential (primary) hypertension: Secondary | ICD-10-CM

## 2021-09-21 HISTORY — DX: Obstructive sleep apnea (adult) (pediatric): G47.33

## 2021-09-21 LAB — BASIC METABOLIC PANEL
Anion gap: 8 (ref 5–15)
BUN: 21 mg/dL — ABNORMAL HIGH (ref 6–20)
CO2: 22 mmol/L (ref 22–32)
Calcium: 8.5 mg/dL — ABNORMAL LOW (ref 8.9–10.3)
Chloride: 109 mmol/L (ref 98–111)
Creatinine, Ser: 1.22 mg/dL (ref 0.61–1.24)
GFR, Estimated: >60 mL/min (ref >60)
Glucose, Bld: 106 mg/dL — ABNORMAL HIGH (ref 70–99)
Potassium: 3.4 mmol/L — ABNORMAL LOW (ref 3.5–5.1)
Sodium: 139 mmol/L (ref 135–145)

## 2021-09-21 LAB — HEPATIC FUNCTION PANEL
ALT: 40 U/L (ref 0–44)
AST: 29 U/L (ref 15–41)
Albumin: 3.7 g/dL (ref 3.5–5.0)
Alkaline Phosphatase: 88 U/L (ref 38–126)
Bilirubin, Direct: 0.1 mg/dL (ref 0.0–0.2)
Indirect Bilirubin: 0.7 mg/dL (ref 0.3–0.9)
Total Bilirubin: 0.8 mg/dL (ref 0.3–1.2)
Total Protein: 6.6 g/dL (ref 6.5–8.1)

## 2021-09-21 LAB — CBC
HCT: 43.4 % (ref 39.0–52.0)
Hemoglobin: 14.4 g/dL (ref 13.0–17.0)
MCH: 30.1 pg (ref 26.0–34.0)
MCHC: 33.2 g/dL (ref 30.0–36.0)
MCV: 90.6 fL (ref 80.0–100.0)
Platelets: 315 10*3/uL (ref 150–400)
RBC: 4.79 MIL/uL (ref 4.22–5.81)
RDW: 15.7 % — ABNORMAL HIGH (ref 11.5–15.5)
WBC: 10.2 10*3/uL (ref 4.0–10.5)
nRBC: 0 % (ref 0.0–0.2)

## 2021-09-21 LAB — BRAIN NATRIURETIC PEPTIDE: B Natriuretic Peptide: 1380 pg/mL — ABNORMAL HIGH (ref 0.0–100.0)

## 2021-09-21 LAB — RESP PANEL BY RT-PCR (FLU A&B, COVID) ARPGX2
Influenza A by PCR: NEGATIVE
Influenza B by PCR: NEGATIVE
SARS Coronavirus 2 by RT PCR: NEGATIVE

## 2021-09-21 LAB — MAGNESIUM: Magnesium: 2 mg/dL (ref 1.7–2.4)

## 2021-09-21 LAB — TROPONIN I (HIGH SENSITIVITY)
Troponin I (High Sensitivity): 33 ng/L — ABNORMAL HIGH (ref <18)
Troponin I (High Sensitivity): 31 ng/L — ABNORMAL HIGH (ref <18)

## 2021-09-21 LAB — D-DIMER, QUANTITATIVE: D-Dimer, Quant: 0.88 ug/mL-FEU — ABNORMAL HIGH (ref 0.00–0.50)

## 2021-09-21 MED ORDER — POTASSIUM CHLORIDE CRYS ER 20 MEQ PO TBCR: 40.0000 meq | EXTENDED_RELEASE_TABLET | Freq: Four times a day (QID) | ORAL | Status: DC

## 2021-09-21 MED ORDER — FUROSEMIDE 10 MG/ML IJ SOLN
40.0000 mg | Freq: Two times a day (BID) | INTRAMUSCULAR | Status: DC
  Administered 2021-09-22: 40 mg via INTRAVENOUS
  Filled 2021-09-21: qty 4

## 2021-09-21 MED ORDER — POTASSIUM CHLORIDE CRYS ER 20 MEQ PO TBCR
40.0000 meq | EXTENDED_RELEASE_TABLET | ORAL | Status: AC
  Administered 2021-09-21 – 2021-09-22 (×2): 40 meq via ORAL
  Filled 2021-09-21 (×2): qty 2

## 2021-09-21 MED ORDER — ENOXAPARIN SODIUM 40 MG/0.4ML IJ SOSY
40.0000 mg | PREFILLED_SYRINGE | INTRAMUSCULAR | Status: DC
  Administered 2021-09-22 – 2021-09-24 (×3): 40 mg via SUBCUTANEOUS
  Filled 2021-09-21 (×3): qty 0.4

## 2021-09-21 MED ORDER — HYDRALAZINE HCL 20 MG/ML IJ SOLN: 5.0000 mg | Freq: Four times a day (QID) | INTRAMUSCULAR | Status: DC | PRN

## 2021-09-21 MED ORDER — FUROSEMIDE 10 MG/ML IJ SOLN
40.0000 mg | Freq: Once | INTRAMUSCULAR | Status: AC
  Administered 2021-09-21: 40 mg via INTRAVENOUS
  Filled 2021-09-21: qty 4

## 2021-09-21 MED ORDER — IOHEXOL 350 MG/ML SOLN
100.0000 mL | Freq: Once | INTRAVENOUS | Status: AC | PRN
  Administered 2021-09-21: 100 mL via INTRAVENOUS

## 2021-09-21 MED ORDER — MAGNESIUM SULFATE IN D5W 1-5 GM/100ML-% IV SOLN
1.0000 g | Freq: Once | INTRAVENOUS | Status: AC
  Administered 2021-09-21: 1 g via INTRAVENOUS
  Filled 2021-09-21: qty 100

## 2021-09-21 NOTE — ED Provider Notes (Signed)
[facility] EMERGENCY DEPARTMENT Provider Note   CSN: [id number] Arrival date & time: 09/21/21  1139     History Chief Complaint  Patient presents with   Shortness of Breath    Michael Hester is a 55 y.o. male.  HPI  Patient with history including hypertension, sleep apnea using CPAP presents with complaints of shortness of breath.  Patient states this has been going on since Thanksgiving, he states he developed a URI-like symptom and since then he has not been feeling well since.  He endorses continuing shortness of breath especially on exertion, he has no pleuritic chest pain, or actual chest pain, but does endorse an intermittent productive cough, he denies nasal congestion, fevers, chills, sore throat, stomach pains, nausea, vomit, diarrhea, general body aches.  He is vaccine against COVID and influenza.  He does endorse that he has noticed some left leg swelling,  start about 3 days ago, states his left leg gotten larger and feels tight, denies calf pain, he denies recent surgeries, long car rides, no bleeding disorders, he does endorse that he is very sedentary.  He has no significant cardiac history, no history of PEs or DVTs, currently not on hormone therapy.  He denies illicit drug use, does not smoke, no family history of cardiac issues.  Patient denies alleviating or aggravating factors.   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  Review of Systems   Review of Systems  Constitutional:  Negative for chills and fever.  HENT:  Negative for congestion.   Respiratory:  Positive for shortness of breath.   Cardiovascular:  Positive for leg swelling. Negative for chest pain and palpitations.  Gastrointestinal:  Negative for abdominal pain, diarrhea, nausea and vomiting.  Genitourinary:  Negative for enuresis.  Musculoskeletal:  Negative for back pain.  Skin:  Negative for rash.  Neurological:  Negative for dizziness and headaches.  Hematological:  Does not bruise/bleed easily.   Physical Exam Updated Vital Signs BP (!) 140/108   Pulse (!) 111   Temp 97.9 F (36.6 C) (Oral)   Resp (!) 25   SpO2 100%   Physical Exam Vitals and nursing note reviewed.  Constitutional:      General: He is not in acute distress.    Appearance: He is not ill-appearing.  HENT:     Head: Normocephalic and atraumatic.     Nose: No congestion.     Mouth/Throat:  Mouth: Mucous membranes are moist.     Pharynx: Oropharynx is clear.  Eyes:     Conjunctiva/sclera: Conjunctivae normal.  Cardiovascular:     Rate and Rhythm: Normal rate and regular rhythm.     Pulses: Normal pulses.     Heart sounds: No murmur heard.   No friction rub. No gallop.  Pulmonary:     Effort: No respiratory distress.     Breath sounds: No wheezing, rhonchi or rales.     Comments: Patient does not show signs of respiratory distress, able speak in full sentences, he is nonhypoxic, but is slightly tachypneic, lung sounds had slight bibasilar Rales no wheezing, rhonchi or  stridor present. Abdominal:     Palpations: Abdomen is soft.     Tenderness: There is no abdominal tenderness. There is no right CVA tenderness or left CVA tenderness.  Musculoskeletal:     Left lower leg: Edema present.     Comments: Patient noted pedal edema he had 1+ edema up into the mid shin of the left leg, left leg was slightly larger than the right leg, no overlying skin changes, neurovascular fully intact.  No calf tenderness or palpable cords.  Skin:    General: Skin is warm and dry.  Neurological:     Mental Status: He is alert.  Psychiatric:        Mood and Affect: Mood normal.    ED Results / Procedures / Treatments   Labs (all labs ordered are listed, but only abnormal results are displayed) Labs Reviewed  BASIC METABOLIC PANEL - Abnormal; Notable for the following components:      Result Value   Potassium 3.4 (*)    Glucose, Bld 106 (*)    BUN 21 (*)    Calcium 8.5 (*)    All other components within normal limits  CBC - Abnormal; Notable for the following components:   RDW 15.7 (*)    All other components within normal limits  D-DIMER, QUANTITATIVE - Abnormal; Notable for the following components:   D-Dimer, Quant 0.88 (*)    All other components within normal limits  BRAIN NATRIURETIC PEPTIDE - Abnormal; Notable for the following components:   B Natriuretic Peptide 1,380.0 (*)    All other components within normal limits  TROPONIN I (HIGH SENSITIVITY) - Abnormal; Notable for the following components:   Troponin I (High Sensitivity) 33 (*)    All other components within normal limits  TROPONIN I (HIGH SENSITIVITY) - Abnormal; Notable for the following components:   Troponin I (High Sensitivity) 31 (*)    All other components within normal limits  RESP PANEL BY RT-PCR (FLU A&B, COVID) ARPGX2  HEPATIC FUNCTION PANEL    EKG None  Radiology DG Chest 2 View  Result Date: 09/21/2021 CLINICAL DATA:  Short of breath EXAM: CHEST - 2 VIEW COMPARISON:   07/29/2016 FINDINGS: Cardiac enlargement which has progressed in the interval. Vascular congestion with mild interstitial edema. No pleural effusion. No focal infiltrate. IMPRESSION: Cardiac enlargement with vascular congestion and mild interstitial edema compatible with heart failure. Electronically Signed   By: Franchot Gallo M.D.   On: 09/21/2021 13:25   CT Angio Chest PE W and/or Wo Contrast  Result Date: 09/21/2021 CLINICAL DATA:  Shortness of breath EXAM: CT ANGIOGRAPHY CHEST WITH CONTRAST TECHNIQUE: Multidetector CT imaging of the chest was performed using the standard protocol during bolus administration of intravenous contrast. Multiplanar CT image reconstructions and MIPs were obtained to evaluate the vascular anatomy. CONTRAST:  151mL OMNIPAQUE IOHEXOL 350 MG/ML SOLN COMPARISON:  Previous studies including the examination done earlier today FINDINGS: Cardiovascular: Heart is enlarged in size. There is reflux of contrast into hepatic veins suggesting tricuspid incompetence. Contrast density in thoracic aorta is less than adequate to evaluate the lumen. There are no intraluminal filling defects in the central pulmonary artery branches. There is ectasia of main pulmonary artery measuring 3.8 cm in diameter suggesting possible pulmonary arterial hypertension. Mediastinum/Nodes: There are subcentimeter nodes in mediastinum. Lungs/Pleura: There are subtle ground-glass densities in the parahilar regions. In image 106 of series 6, there is 12 mm noncalcified nodule in the right lower lobe. Upper Abdomen: Gallbladder stones are seen. Musculoskeletal: Unremarkable. Review of the MIP images confirms the above findings. IMPRESSION: There is no evidence of pulmonary artery embolism. Contrast density in the thoracic aorta is less than adequate to evaluate the lumen. There are ground-glass densities in the parahilar regions, possibly suggesting mild interstitial edema or interstitial pneumonitis. Small to moderate  bilateral pleural effusions, more so on the right side. There is ectasia of main pulmonary artery suggesting pulmonary arterial hypertension. There is reflux of contrast into hepatic veins suggesting tricuspid incompetence. There is 12 mm noncalcified nodule in the right lower lobe. This may suggest granuloma or malignant neoplasm. Short-term follow-up CT and PET-CT if warranted may be considered. Gallbladder stones. Electronically Signed   By: Elmer Picker M.D.   On: 09/21/2021 16:47   US Venous Img Lower Unilateral Left  Result Date: 09/21/2021 CLINICAL DATA:  LEFT lower extremity swelling. EXAM: LEFT LOWER EXTREMITY VENOUS DOPPLER ULTRASOUND TECHNIQUE: Gray-scale sonography with compression, as well as color and duplex ultrasound, were performed to evaluate the deep venous system(s) from the level of the common femoral vein through the popliteal and proximal calf veins. COMPARISON:  Chest XR, concurrent. FINDINGS: VENOUS Normal compressibility of the LEFT common femoral, superficial femoral, and popliteal veins, as well as the visualized calf veins. Visualized portions of profunda femoral vein and great saphenous vein unremarkable. No filling defects to suggest DVT on grayscale or color Doppler imaging. Doppler waveforms show normal direction of venous flow, normal respiratory plasticity and response to augmentation. Limited views of the contralateral common femoral vein are unremarkable. OTHER No evidence of superficial thrombophlebitis or abnormal fluid collection. Limitations: none IMPRESSION: No femoropopliteal DVT within the LEFT lower extremity. Michaelle Birks, MD [facility] Electronically Signed   By: Michaelle Birks M.D.   On: 09/21/2021 14:51    Procedures Procedures   Medications Ordered in ED Medications  furosemide (LASIX) injection 40 mg (40 mg Intravenous Given 09/21/21 1528)  iohexol (OMNIPAQUE) 350 MG/ML injection 100 mL (100  mLs Intravenous Contrast Given 09/21/21 1621)    ED Course  I have reviewed the triage vital signs and the nursing notes.  Pertinent labs & imaging results that were available during my care of the patient were reviewed by me and considered in my medical decision making (see chart for details).    MDM Rules/Calculators/A&P                          Initial impression-presents with shortness of breath, he is alert, no acute distress, vital signs noted for tachycardia and tachypnea.  I am concerned for possible PE, CHF, DVT will obtain chest pain work-up, add on D-dimer, and reassess.  Work-up-CBC is unremarkable, BMP shows potassium 3.4, glucose 106, BUN of 21, calcium 8.5, first troponin is 33 second  troponin 31, D-dimer 0.88, hepatic function panel unremarkable.  Chest x-ray reveals cardiac enlargement with vascular congestion and mild interstitial edema compatible with heart failure.  EKG sinus tach without signs of ischemia.  Reassessment-D-dimer is elevated, will obtain CTA of chest for rule out of possible PE.  Patient has an elevated BNP of 1300, will start him on Lasix we will also consult with cardiology for further recommendations.  Reassessed patient is having good urine output  will continue to monitor.  Patient is a downtrending troponin, will recommend hospital admission for likely CHF exacerbation will need further evaluation.  Patient agreed in this plan.  Updated patient recommendation from cardiology during this plan will consult hospitalist team for admission.  Consult-spoke with Dr. Marlou Porch of cardiology he recommends aggressive diuresis, and further work-up including echo.  Patient is okay to remain at [facility], would recommend cardiology consultation.  Spoke with Dr. Waldron Labs who will admit the patient.  Rule out- I have low suspicion for ACS as history is atypical, patient has no cardiac history, EKG was sinus rhythm without signs of ischemia, patient has a  downtrending troponin.  I suspect likely elevation is secondary to demand ischemia as he appears to be volume overloaded.  Low suspicion for PE CTA of chest is negative for acute findings.  Low suspicion for AAA or aortic dissection as history is atypical, patient has low risk factors.  Low suspicion for hypertensive emergency as there is no signs of organ damage seen on lab work.  Low suspicion for systemic infection as patient is nontoxic-appearing, vital signs reassuring, no obvious source infection noted on exam.   Plan-admission for shortness of breath likely secondary due to CHF exacerbation.  Final Clinical Impression(s) / ED Diagnoses Final diagnoses:  Acute congestive heart failure, unspecified heart failure type [facility])    Rx / DC Orders ED Discharge Orders     None        Marcello Fennel, PA-C 09/21/21 1840    Fredia Sorrow, MD 09/27/21 [phone]

## 2021-09-21 NOTE — H&P (Signed)
TRH H&P   Patient Demographics:    Michael Hester, is a 55 y.o. male  MRN: [id number]   DOB - 11-Dec-1965  Admit Date - 09/21/2021  Outpatient Primary MD for the patient is Sharilyn Sites, MD  Referring MD/NP/PA: PA Ileene Patrick    Patient coming from: home  Chief Complaint  Patient presents with   Shortness of Breath      HPI:    Michael Hester  is a 55 y.o. male, medical history of hypertension, sleep apnea, used to be on CPAP, but no more for last few month, as well he used to be on lisinopril/hydrochlorothiazide, but reports it was not renewed by his PCP as blood pressure has been acceptable. -Patient presents to ED secondary to complaints of shortness of breath and lower extremity edema, patient reports some viral illness around Thanksgiving time, but this has resolved, he denies fever, chills, nausea, vomiting, he does report progressive dyspnea over the last couple weeks, worsening lower extremity edema as well, denies any chest pain, pleuritic chest pain. -in ED patient with no hypoxia, but significant lower extremity edema, elevated BNP, and volume overload on imaging, D-dimer is mildly elevated, CTA with no evidence of PE, patient felt much better after receiving 40 mg of IV Lasix where he had 1400 cc urine output.    Review of systems:    In addition to the HPI above,  No Fever-chills, No Headache, No changes with Vision or hearing, No problems swallowing food or Liquids, No Chest pain, Cough, he does report dyspnea and lower extremity edema. No Abdominal pain, No Nausea or Vommitting, Bowel movements are regular, No Blood in stool or Urine, No dysuria, No new skin rashes or bruises, No new joints pains-aches,  No new weakness, tingling, numbness in any extremity, No recent weight gain or loss, No polyuria, polydypsia or polyphagia, No significant Mental  Stressors.  A full 10 point Review of Systems was done, except as stated above, all other Review of Systems were negative.   With Past History of the following :    Past Medical History:  Diagnosis Date   Hypertension       Past Surgical History:  Procedure Laterality Date   CYST EXCISION PERINEAL        Social History:     Social History   Tobacco Use   Smoking status: Never   Smokeless tobacco: Never  Substance Use Topics   Alcohol use: Never        Family History :     Family History  Problem Relation Age of Onset   Gout Mother    Diabetes Father    Kidney failure Father       Home Medications:   Prior to Admission medications   Not on File     Allergies:    No Known Allergies   Physical Exam:   Vitals  Blood  pressure (!) 140/108, pulse (!) 111, temperature 97.9 F (36.6 C), temperature source Oral, resp. rate (!) 25, SpO2 100 %.   1. General developed male, laying in bed in no apparent distress  2. Normal affect and insight, Not Suicidal or Homicidal, Awake Alert, Oriented X 3.  3. No F.N deficits, ALL C.Nerves Intact, Strength 5/5 all 4 extremities, Sensation intact all 4 extremities, Plantars down going.  4. Ears and Eyes appear Normal, Conjunctivae clear, PERRLA. Moist Oral Mucosa.  5. Supple Neck, No JVD, No cervical lymphadenopathy appriciated, No Carotid Bruits.  6. Symmetrical Chest wall movement, diminished air entry at the bases  7.  Tachycardic, No Gallops, Rubs or Murmurs, No Parasternal Heave.  +2 edema  8. Positive Bowel Sounds, Abdomen Soft, No tenderness, No organomegaly appriciated,No rebound -guarding or rigidity.  9.  No Cyanosis, Normal Skin Turgor, No Skin Rash or Bruise.  10. Good muscle tone,  joints appear normal , no effusions, Normal ROM.  11. No Palpable Lymph Nodes in Neck or Axillae    Data Review:    CBC Recent Labs  Lab 09/21/21 1314  WBC 10.2  HGB 14.4  HCT 43.4  PLT 315  MCV 90.6  MCH  30.1  MCHC 33.2  RDW 15.7*   ------------------------------------------------------------------------------------------------------------------  Chemistries  Recent Labs  Lab 09/21/21 1314  NA 139  K 3.4*  CL 109  CO2 22  GLUCOSE 106*  BUN 21*  CREATININE 1.22  CALCIUM 8.5*  AST 29  ALT 40  ALKPHOS 88  BILITOT 0.8   ------------------------------------------------------------------------------------------------------------------ CrCl cannot be calculated (Unknown ideal weight.). ------------------------------------------------------------------------------------------------------------------ No results for input(s): TSH, T4TOTAL, T3FREE, THYROIDAB in the last 72 hours.  Invalid input(s): FREET3  Coagulation profile No results for input(s): INR, PROTIME in the last 168 hours. ------------------------------------------------------------------------------------------------------------------- Recent Labs    09/21/21 1314  DDIMER 0.88*   -------------------------------------------------------------------------------------------------------------------  Cardiac Enzymes No results for input(s): CKMB, TROPONINI, MYOGLOBIN in the last 168 hours.  Invalid input(s): CK ------------------------------------------------------------------------------------------------------------------    Component Value Date/Time   BNP 1,380.0 (H) 09/21/2021 1314     ---------------------------------------------------------------------------------------------------------------  Urinalysis    Component Value Date/Time   COLORURINE YELLOW 07/29/2016 0834   APPEARANCEUR CLEAR 07/29/2016 0834   LABSPEC 1.015 07/29/2016 0834   PHURINE 7.0 07/29/2016 0834   GLUCOSEU NEGATIVE 07/29/2016 0834   HGBUR NEGATIVE 07/29/2016 0834   BILIRUBINUR NEGATIVE 07/29/2016 0834   KETONESUR NEGATIVE 07/29/2016 0834   PROTEINUR NEGATIVE 07/29/2016 0834   NITRITE NEGATIVE 07/29/2016 0834   LEUKOCYTESUR  NEGATIVE 07/29/2016 0834    ----------------------------------------------------------------------------------------------------------------   Imaging Results:    DG Chest 2 View  Result Date: 09/21/2021 CLINICAL DATA:  Short of breath EXAM: CHEST - 2 VIEW COMPARISON:  07/29/2016 FINDINGS: Cardiac enlargement which has progressed in the interval. Vascular congestion with mild interstitial edema. No pleural effusion. No focal infiltrate. IMPRESSION: Cardiac enlargement with vascular congestion and mild interstitial edema compatible with heart failure. Electronically Signed   By: Marlan Palau M.D.   On: 09/21/2021 13:25   CT Angio Chest PE W and/or Wo Contrast  Result Date: 09/21/2021 CLINICAL DATA:  Shortness of breath EXAM: CT ANGIOGRAPHY CHEST WITH CONTRAST TECHNIQUE: Multidetector CT imaging of the chest was performed using the standard protocol during bolus administration of intravenous contrast. Multiplanar CT image reconstructions and MIPs were obtained to evaluate the vascular anatomy. CONTRAST:  OMNIPAQUE IOHEXOL 350 MG/ML SOLN COMPARISON:  Previous studies including the examination done earlier today FINDINGS: Cardiovascular: Heart is enlarged in size. There is  reflux of contrast into hepatic veins suggesting tricuspid incompetence. Contrast density in thoracic aorta is less than adequate to evaluate the lumen. There are no intraluminal filling defects in the central pulmonary artery branches. There is ectasia of main pulmonary artery measuring 3.8 cm in diameter suggesting possible pulmonary arterial hypertension. Mediastinum/Nodes: There are subcentimeter nodes in mediastinum. Lungs/Pleura: There are subtle ground-glass densities in the parahilar regions. In image 106 of series 6, there is 12 mm noncalcified nodule in the right lower lobe. Upper Abdomen: Gallbladder stones are seen. Musculoskeletal: Unremarkable. Review of the MIP images confirms the above findings. IMPRESSION: There  is no evidence of pulmonary artery embolism. Contrast density in the thoracic aorta is less than adequate to evaluate the lumen. There are ground-glass densities in the parahilar regions, possibly suggesting mild interstitial edema or interstitial pneumonitis. Small to moderate bilateral pleural effusions, more so on the right side. There is ectasia of main pulmonary artery suggesting pulmonary arterial hypertension. There is reflux of contrast into hepatic veins suggesting tricuspid incompetence. There is 12 mm noncalcified nodule in the right lower lobe. This may suggest granuloma or malignant neoplasm. Short-term follow-up CT and PET-CT if warranted may be considered. Gallbladder stones. Electronically Signed   By: Elmer Picker M.D.   On: 09/21/2021 16:47   US Venous Img Lower Unilateral Left  Result Date: 09/21/2021 CLINICAL DATA:  LEFT lower extremity swelling. EXAM: LEFT LOWER EXTREMITY VENOUS DOPPLER ULTRASOUND TECHNIQUE: Gray-scale sonography with compression, as well as color and duplex ultrasound, were performed to evaluate the deep venous system(s) from the level of the common femoral vein through the popliteal and proximal calf veins. COMPARISON:  Chest XR, concurrent. FINDINGS: VENOUS Normal compressibility of the LEFT common femoral, superficial femoral, and popliteal veins, as well as the visualized calf veins. Visualized portions of profunda femoral vein and great saphenous vein unremarkable. No filling defects to suggest DVT on grayscale or color Doppler imaging. Doppler waveforms show normal direction of venous flow, normal respiratory plasticity and response to augmentation. Limited views of the contralateral common femoral vein are unremarkable. OTHER No evidence of superficial thrombophlebitis or abnormal fluid collection. Limitations: none IMPRESSION: No femoropopliteal DVT within the LEFT lower extremity. Michaelle Birks, MD [facility]  Radiology Electronically Signed   By: Michaelle Birks M.D.   On: 09/21/2021 14:51    My personal review of EKG: Sinus tachycardia at 125, QTC 588   Assessment & Plan:    Principal Problem:   Acute CHF (congestive heart failure) (HCC) Active Problems:   OSA (obstructive sleep apnea)  Acute CHF -With evidence of new CHF, significant bilateral lower extremity edema, volume overload on imaging, and elevated BNP -He is admitted under CHF pathway, received 40 mg of IV Lasix with 1400 cc urine output, will hold on further diuresis this evening and start on Lasix 40 mg IV BID , will check daily weight, strict ins and out. -Will check 2D echo -As well patient reports symptoms started after Thanksgiving where he had some viral illness,  will await for 2D echo.  OSA -He has not been using his CPAP for last few month, will continue during hospital stay  HTN -Overall blood pressure is acceptable, he used to be on lisinopril/hydrochlorothiazide in the past not taking it anymore. -We will keep him on as needed hydralazine, will hold on initiating any new medications to allow room for IV diuresis.   Hypokalemia -Repleted especially he received IV Lasix.  Prolonged QTC -We will replete  low potassium, will give 1 g of magnesium sulfate, will monitor on telemetry and recheck QTC in a.m.  DVT Prophylaxis  Lovenox   AM Labs Ordered, also please review Full Orders  Family Communication: Admission, patients condition and plan of care including tests being ordered have been discussed with the patient and wife* who indicate understanding and agree with the plan and Code Status.  Code Status full  Likely DC to  home  Condition GUARDED    Consults called: none    Admission status: observation    Time spent in minutes : 65 minutes   Phillips Climes M.D on 09/21/2021 at 6:49 PM   [facility] - Office  [phone]

## 2021-09-21 NOTE — Progress Notes (Signed)
Patient refused CPAP , but stated he would call if he needed it. He hasn't been wearing CPAP at home.

## 2021-09-21 NOTE — ED Notes (Addendum)
Spoke to Dr.Elgergawy about concerns about elevated MEWs score. Would like patient to continue to tele floor. Orders provided prn for elevated SBP, aware of elevated pulse rate. Patient is A&Ox4 at this time, ambulates independently to restroom.

## 2021-09-21 NOTE — ED Triage Notes (Signed)
Shortness of breath for over a week, worse with exertion ?

## 2021-09-22 ENCOUNTER — Observation Stay (HOSPITAL_COMMUNITY): Payer: Managed Care, Other (non HMO)

## 2021-09-22 ENCOUNTER — Other Ambulatory Visit: Payer: Self-pay

## 2021-09-22 DIAGNOSIS — I10 Essential (primary) hypertension: Secondary | ICD-10-CM

## 2021-09-22 DIAGNOSIS — Z8249 Family history of ischemic heart disease and other diseases of the circulatory system: Secondary | ICD-10-CM | POA: Diagnosis not present

## 2021-09-22 DIAGNOSIS — I509 Heart failure, unspecified: Secondary | ICD-10-CM | POA: Insufficient documentation

## 2021-09-22 DIAGNOSIS — J90 Pleural effusion, not elsewhere classified: Secondary | ICD-10-CM

## 2021-09-22 DIAGNOSIS — I5021 Acute systolic (congestive) heart failure: Secondary | ICD-10-CM | POA: Diagnosis not present

## 2021-09-22 DIAGNOSIS — R0602 Shortness of breath: Secondary | ICD-10-CM | POA: Diagnosis present

## 2021-09-22 DIAGNOSIS — Z20822 Contact with and (suspected) exposure to covid-19: Secondary | ICD-10-CM | POA: Diagnosis present

## 2021-09-22 DIAGNOSIS — R911 Solitary pulmonary nodule: Secondary | ICD-10-CM

## 2021-09-22 DIAGNOSIS — I110 Hypertensive heart disease with heart failure: Secondary | ICD-10-CM | POA: Diagnosis present

## 2021-09-22 DIAGNOSIS — R0609 Other forms of dyspnea: Secondary | ICD-10-CM

## 2021-09-22 DIAGNOSIS — I272 Pulmonary hypertension, unspecified: Secondary | ICD-10-CM | POA: Diagnosis present

## 2021-09-22 DIAGNOSIS — I5082 Biventricular heart failure: Secondary | ICD-10-CM | POA: Diagnosis present

## 2021-09-22 DIAGNOSIS — E876 Hypokalemia: Secondary | ICD-10-CM | POA: Diagnosis present

## 2021-09-22 DIAGNOSIS — R000 Tachycardia, unspecified: Secondary | ICD-10-CM | POA: Diagnosis present

## 2021-09-22 DIAGNOSIS — I428 Other cardiomyopathies: Secondary | ICD-10-CM | POA: Diagnosis present

## 2021-09-22 DIAGNOSIS — G4733 Obstructive sleep apnea (adult) (pediatric): Secondary | ICD-10-CM | POA: Diagnosis present

## 2021-09-22 DIAGNOSIS — I2510 Atherosclerotic heart disease of native coronary artery without angina pectoris: Secondary | ICD-10-CM | POA: Diagnosis present

## 2021-09-22 DIAGNOSIS — I5041 Acute combined systolic (congestive) and diastolic (congestive) heart failure: Secondary | ICD-10-CM | POA: Diagnosis present

## 2021-09-22 DIAGNOSIS — Z6841 Body Mass Index (BMI) 40.0 and over, adult: Secondary | ICD-10-CM | POA: Diagnosis not present

## 2021-09-22 LAB — COMPREHENSIVE METABOLIC PANEL
ALT: 35 U/L (ref 0–44)
AST: 25 U/L (ref 15–41)
Albumin: 3.8 g/dL (ref 3.5–5.0)
Alkaline Phosphatase: 87 U/L (ref 38–126)
Anion gap: 8 (ref 5–15)
BUN: 15 mg/dL (ref 6–20)
CO2: 25 mmol/L (ref 22–32)
Calcium: 8.8 mg/dL — ABNORMAL LOW (ref 8.9–10.3)
Chloride: 109 mmol/L (ref 98–111)
Creatinine, Ser: 1.2 mg/dL (ref 0.61–1.24)
GFR, Estimated: >60 mL/min (ref >60)
Glucose, Bld: 106 mg/dL — ABNORMAL HIGH (ref 70–99)
Potassium: 3.6 mmol/L (ref 3.5–5.1)
Sodium: 142 mmol/L (ref 135–145)
Total Bilirubin: 1.2 mg/dL (ref 0.3–1.2)
Total Protein: 6.9 g/dL (ref 6.5–8.1)

## 2021-09-22 LAB — HIV ANTIBODY (ROUTINE TESTING W REFLEX): HIV Screen 4th Generation wRfx: NONREACTIVE

## 2021-09-22 LAB — CBC
HCT: 44 % (ref 39.0–52.0)
Hemoglobin: 14.6 g/dL (ref 13.0–17.0)
MCH: 30.2 pg (ref 26.0–34.0)
MCHC: 33.2 g/dL (ref 30.0–36.0)
MCV: 90.9 fL (ref 80.0–100.0)
Platelets: 312 10*3/uL (ref 150–400)
RBC: 4.84 MIL/uL (ref 4.22–5.81)
RDW: 15.9 % — ABNORMAL HIGH (ref 11.5–15.5)
WBC: 9.4 10*3/uL (ref 4.0–10.5)
nRBC: 0 % (ref 0.0–0.2)

## 2021-09-22 LAB — ECHOCARDIOGRAM COMPLETE
AR max vel: 2.26 cm2
AV Area VTI: 1.89 cm2
AV Area mean vel: 1.85 cm2
AV Mean grad: 3 mmHg
AV Peak grad: 4.8 mmHg
Ao pk vel: 1.1 m/s
Area-P 1/2: 6.54 cm2
Height: 65 in
MV VTI: 1.81 cm2
S' Lateral: 5.3 cm
Weight: 3858.93 oz

## 2021-09-22 MED ORDER — HYDRALAZINE HCL 25 MG PO TABS
25.0000 mg | ORAL_TABLET | Freq: Three times a day (TID) | ORAL | Status: DC | PRN
  Administered 2021-09-22 – 2021-09-25 (×2): 25 mg via ORAL
  Filled 2021-09-22 (×2): qty 1

## 2021-09-22 MED ORDER — FUROSEMIDE 40 MG PO TABS
40.0000 mg | ORAL_TABLET | Freq: Every day | ORAL | Status: DC
  Administered 2021-09-23 – 2021-09-24 (×2): 40 mg via ORAL
  Filled 2021-09-22 (×2): qty 1

## 2021-09-22 MED ORDER — PERFLUTREN LIPID MICROSPHERE
1.0000 mL | INTRAVENOUS | Status: DC | PRN
  Administered 2021-09-22: 6 mL via INTRAVENOUS

## 2021-09-22 MED ORDER — PERFLUTREN LIPID MICROSPHERE
1.0000 mL | INTRAVENOUS | Status: AC | PRN
Start: 2021-09-22 — End: 2021-09-22
  Filled 2021-09-22: qty 10

## 2021-09-22 MED ORDER — LOSARTAN POTASSIUM 25 MG PO TABS
25.0000 mg | ORAL_TABLET | Freq: Every day | ORAL | Status: DC
  Administered 2021-09-22 – 2021-09-24 (×3): 25 mg via ORAL
  Filled 2021-09-22 (×3): qty 1

## 2021-09-22 NOTE — Progress Notes (Signed)
Discussed the patients BP via text with Dr. Herma Carson . She is agreeable to not medicate BP till SBP >160 as stated in hydrazine order

## 2021-09-22 NOTE — Progress Notes (Signed)
*  PRELIMINARY RESULTS* Echocardiogram 2D Echocardiogram has been performed.  Michael Hester 09/22/2021, 2:15 PM

## 2021-09-22 NOTE — Assessment & Plan Note (Signed)
Body mass index is 40.13 kg/m.

## 2021-09-22 NOTE — Assessment & Plan Note (Addendum)
Patient was previously on lisinopril and hydrochlorothiazide as an outpatient but has discontinued their use. Blood pressure uncontrolled this admission. -Continue Losartan and Toprol XL

## 2021-09-22 NOTE — Assessment & Plan Note (Signed)
12 mm RLL non-calcified nodule. Imaging differential includes granuloma vs malignant neoplasm. Patient will need to have short-term follow-up CT or PET-CT.

## 2021-09-22 NOTE — Assessment & Plan Note (Addendum)
New diagnosis. BNP of 1380. Flat/low troponin. No hypoxia but interstitial edema and pleural effusions noted on CT imaging. CT imaging also with evidence of possibly pulmonary artery hypertension with suggested tricuspid valve incompetence. No measured urine output documented over the last 24 hours, with several unmeasured occurrences documented. Weight is down about 500 g from admission. Transthoracic Echocardiogram obtained on 12/10 significant for EF of 25-30%. Cardiology consulted with initial recommendation for no inpatient ischemic workup, however, now plan is for R/L heart catheterization -Continue Lasix 40 mg daily PO -Daily BMP -Daily weights/strict in and out -Continue Losartan and Toprol XL

## 2021-09-22 NOTE — Progress Notes (Signed)
On arrival to floor on initial vitals patients BP 140/118 manually. Heart rate ranging bet 118-120. On call provider notified. Current BP medications to only to be given if SBP >160

## 2021-09-22 NOTE — Hospital Course (Addendum)
Michael Hester is a 55 y.o. male with a history of hypertension, OSA. Patient presented secondary to shortness of breath with evidence of likely heart failure. No prior diagnosis of heart failure. IV lasix initiated. Transthoracic Echocardiogram significant for combined heart failure. Cardiology consulted with initial plan for no inpatient ischemic workup, but now with recommendations for L/R heart catheterization. Patient to transfer to [facility]. Patient will transfer to the cardiology service for continued management.

## 2021-09-22 NOTE — Assessment & Plan Note (Addendum)
Unsure of etiology but likely cardiogenic. CT imaging with evidence of a unspecified nodule. On room air but symptomatic. Diagnostic thoracentesis would be beneficial. -US thoracentesis (therapeutic and diagnostic)

## 2021-09-22 NOTE — Assessment & Plan Note (Signed)
Does not use CPAP at home. Ordered on admission.

## 2021-09-22 NOTE — Progress Notes (Signed)
PROGRESS NOTE    Michael Hester  DJS:[id number] DOB: 04-05-1966 DOA: 09/21/2021 PCP: Assunta Found, MD   Brief Narrative: Michael Hester is a 55 y.o. male with a history of hypertension, OSA. Patient presented secondary to shortness of breath with evidence of likely heart failure. No prior diagnosis of heart failure. IV lasix initiated. Transthoracic Echocardiogram ordered and is pending.   Assessment & Plan:   * Acute CHF (congestive heart failure) (HCC) New diagnosis. BNP of 1380. No hypoxia but interstitial edema and pleural effusions noted on CT imaging. CT imaging also with evidence of possibly pulmonary artery hypertension with suggested tricuspid valve incompetence. Significant UOP of 4.6 L over the last 24 hours.  -Transition to Lasix 40 mg daily PO -Follow up Transthoracic Echocardiogram -BMP in AM  Bilateral pleural effusion Unsure of etiology but likely cardiogenic. CT imaging with evidence of a unspecified nodule. On room air but symptomatic. Diagnostic thoracentesis would be beneficial. -US thoracentesis  Pulmonary nodule 12 mm RLL non-calcified nodule. Imaging differential includes granuloma vs malignant neoplasm. Patient will need to have short-term follow-up CT or PET-CT.  Morbid obesity (HCC) Body mass index is 40.13 kg/m.  OSA (obstructive sleep apnea) Does not use CPAP at home. Ordered on admission.     DVT prophylaxis: Lovenox Code Status:   Code Status: Full Code Family Communication: None at bedside Disposition Plan: Discharge home in 1-3 days pending Transthoracic Echocardiogram results, transition to oral Lasix, thoracenteses, improvement of dyspnea   Consultants:  None  Procedures:  None  Antimicrobials: None    Subjective: Feeling better but still dyspneic with minimal movement.  Objective: Vitals:   09/22/21 0000 09/22/21 0200 09/22/21 0500 09/22/21 0636  BP: (!) 146/90 (!) 141/94  (!) 151/111  Pulse: (!) 112 (!) 110  (!) 115   Resp: 18 18    Temp: 97.8 F (36.6 C) 97.8 F (36.6 C)  98 F (36.7 C)  TempSrc: Oral Oral  Oral  SpO2: 95% 96%  96%  Weight:   109.4 kg   Height:        Intake/Output Summary (Last 24 hours) at 09/22/2021 1457 Last data filed at 09/22/2021 0840 Gross per 24 hour  Intake 580 ml  Output 4625 ml  Net -4045 ml   Filed Weights   09/21/21 2200 09/22/21 0500  Weight: 109.5 kg 109.4 kg    Examination:  General exam: Appears calm and comfortable Respiratory system: Diminished breath sounds. Respiratory effort normal. Cardiovascular system: S1 & S2 heard, RRR. Possibly a faint murmur, but nothing prominent Gastrointestinal system: Abdomen is protuberant, soft and nontender. No organomegaly or masses felt. Normal bowel sounds heard. Central nervous system: Alert and oriented. No focal neurological deficits. Musculoskeletal: 1+ BLE edema. No calf tenderness Skin: No cyanosis. No rashes Psychiatry: Judgement and insight appear normal. Mood & affect appropriate.     Data Reviewed: I have personally reviewed following labs and imaging studies  CBC Lab Results  Component Value Date   WBC 9.4 09/22/2021   RBC 4.84 09/22/2021   HGB 14.6 09/22/2021   HCT 44.0 09/22/2021   MCV 90.9 09/22/2021   MCH 30.2 09/22/2021   PLT 312 09/22/2021   MCHC 33.2 09/22/2021   RDW 15.9 (H) 09/22/2021   LYMPHSABS 2.0 07/29/2016   MONOABS 0.6 07/29/2016   EOSABS 0.1 07/29/2016   BASOSABS 0.0 07/29/2016     Last metabolic panel Lab Results  Component Value Date   NA 142 09/22/2021   K 3.6 09/22/2021  CL 109 09/22/2021   CO2 25 09/22/2021   BUN 15 09/22/2021   CREATININE 1.20 09/22/2021   GLUCOSE 106 (H) 09/22/2021   GFRNONAA >60 09/22/2021   GFRAA >60 07/29/2016   CALCIUM 8.8 (L) 09/22/2021   PROT 6.9 09/22/2021   ALBUMIN 3.8 09/22/2021   BILITOT 1.2 09/22/2021   ALKPHOS 87 09/22/2021   AST 25 09/22/2021   ALT 35 09/22/2021   ANIONGAP 8 09/22/2021    CBG (last 3)  No  results for input(s): GLUCAP in the last 72 hours.   GFR: Estimated Creatinine Clearance: 80.3 mL/min (by C-G formula based on SCr of 1.2 mg/dL).  Coagulation Profile: No results for input(s): INR, PROTIME in the last 168 hours.  Recent Results (from the past 240 hour(s))  Resp Panel by RT-PCR (Flu A&B, Covid) Nasopharyngeal Swab     Status: None   Collection Time: 09/21/21  4:00 PM   Specimen: Nasopharyngeal Swab; Nasopharyngeal(NP) swabs in vial transport medium  Result Value Ref Range Status   SARS Coronavirus 2 by RT PCR NEGATIVE NEGATIVE Final    Comment: (NOTE) SARS-CoV-2 target nucleic acids are NOT DETECTED.  The SARS-CoV-2 RNA is generally detectable in upper respiratory specimens during the acute phase of infection. The lowest concentration of SARS-CoV-2 viral copies this assay can detect is 138 copies/mL. A negative result does not preclude SARS-Cov-2 infection and should not be used as the sole basis for treatment or other patient management decisions. A negative result may occur with  improper specimen collection/handling, submission of specimen other than nasopharyngeal swab, presence of viral mutation(s) within the areas targeted by this assay, and inadequate number of viral copies(<138 copies/mL). A negative result must be combined with clinical observations, patient history, and epidemiological information. The expected result is Negative.  Fact Sheet for Patients:  BloggerCourse.com  Fact Sheet for Healthcare Providers:  SeriousBroker.it  This test is no t yet approved or cleared by the [place] FDA and  has been authorized for detection and/or diagnosis of SARS-CoV-2 by FDA under an Emergency Use Authorization (EUA). This EUA will remain  in effect (meaning this test can be used) for the duration of the COVID-19 declaration under Section 564(b)(1) of the Act, 21 U.S.C.section 360bbb-3(b)(1), unless the  authorization is terminated  or revoked sooner.       Influenza A by PCR NEGATIVE NEGATIVE Final   Influenza B by PCR NEGATIVE NEGATIVE Final    Comment: (NOTE) The Xpert Xpress SARS-CoV-2/FLU/RSV plus assay is intended as an aid in the diagnosis of influenza from Nasopharyngeal swab specimens and should not be used as a sole basis for treatment. Nasal washings and aspirates are unacceptable for Xpert Xpress SARS-CoV-2/FLU/RSV testing.  Fact Sheet for Patients: BloggerCourse.com  Fact Sheet for Healthcare Providers: SeriousBroker.it  This test is not yet approved or cleared by the [place] FDA and has been authorized for detection and/or diagnosis of SARS-CoV-2 by FDA under an Emergency Use Authorization (EUA). This EUA will remain in effect (meaning this test can be used) for the duration of the COVID-19 declaration under Section 564(b)(1) of the Act, 21 U.S.C. section 360bbb-3(b)(1), unless the authorization is terminated or revoked.  Performed at [facility], [address], [place], [place] [address]         Radiology Studies: DG Chest 2 View  Result Date: 09/21/2021 CLINICAL DATA:  Short of breath EXAM: CHEST - 2 VIEW COMPARISON:  07/29/2016 FINDINGS: Cardiac enlargement which has progressed in the interval. Vascular congestion with mild  interstitial edema. No pleural effusion. No focal infiltrate. IMPRESSION: Cardiac enlargement with vascular congestion and mild interstitial edema compatible with heart failure. Electronically Signed   By: Marlan Palau M.D.   On: 09/21/2021 13:25   CT Angio Chest PE W and/or Wo Contrast  Result Date: 09/21/2021 CLINICAL DATA:  Shortness of breath EXAM: CT ANGIOGRAPHY CHEST WITH CONTRAST TECHNIQUE: Multidetector CT imaging of the chest was performed using the standard protocol during bolus administration of intravenous contrast. Multiplanar CT image reconstructions and MIPs were  obtained to evaluate the vascular anatomy. CONTRAST:  OMNIPAQUE IOHEXOL 350 MG/ML SOLN COMPARISON:  Previous studies including the examination done earlier today FINDINGS: Cardiovascular: Heart is enlarged in size. There is reflux of contrast into hepatic veins suggesting tricuspid incompetence. Contrast density in thoracic aorta is less than adequate to evaluate the lumen. There are no intraluminal filling defects in the central pulmonary artery branches. There is ectasia of main pulmonary artery measuring 3.8 cm in diameter suggesting possible pulmonary arterial hypertension. Mediastinum/Nodes: There are subcentimeter nodes in mediastinum. Lungs/Pleura: There are subtle ground-glass densities in the parahilar regions. In image 106 of series 6, there is 12 mm noncalcified nodule in the right lower lobe. Upper Abdomen: Gallbladder stones are seen. Musculoskeletal: Unremarkable. Review of the MIP images confirms the above findings. IMPRESSION: There is no evidence of pulmonary artery embolism. Contrast density in the thoracic aorta is less than adequate to evaluate the lumen. There are ground-glass densities in the parahilar regions, possibly suggesting mild interstitial edema or interstitial pneumonitis. Small to moderate bilateral pleural effusions, more so on the right side. There is ectasia of main pulmonary artery suggesting pulmonary arterial hypertension. There is reflux of contrast into hepatic veins suggesting tricuspid incompetence. There is 12 mm noncalcified nodule in the right lower lobe. This may suggest granuloma or malignant neoplasm. Short-term follow-up CT and PET-CT if warranted may be considered. Gallbladder stones. Electronically Signed   By: Ernie Avena M.D.   On: 09/21/2021 16:47   US Venous Img Lower Unilateral Left  Result Date: 09/21/2021 CLINICAL DATA:  LEFT lower extremity swelling. EXAM: LEFT LOWER EXTREMITY VENOUS DOPPLER ULTRASOUND TECHNIQUE: Gray-scale sonography with  compression, as well as color and duplex ultrasound, were performed to evaluate the deep venous system(s) from the level of the common femoral vein through the popliteal and proximal calf veins. COMPARISON:  Chest XR, concurrent. FINDINGS: VENOUS Normal compressibility of the LEFT common femoral, superficial femoral, and popliteal veins, as well as the visualized calf veins. Visualized portions of profunda femoral vein and great saphenous vein unremarkable. No filling defects to suggest DVT on grayscale or color Doppler imaging. Doppler waveforms show normal direction of venous flow, normal respiratory plasticity and response to augmentation. Limited views of the contralateral common femoral vein are unremarkable. OTHER No evidence of superficial thrombophlebitis or abnormal fluid collection. Limitations: none IMPRESSION: No femoropopliteal DVT within the LEFT lower extremity. Roanna Banning, MD [facility] Radiology Electronically Signed   By: Roanna Banning M.D.   On: 09/21/2021 14:51        Scheduled Meds:  enoxaparin (LOVENOX) injection  40 mg Subcutaneous Q24H   [START ON 09/23/2021] furosemide  40 mg Oral Daily   losartan  25 mg Oral Daily   Continuous Infusions:   LOS: 0 days     Jacquelin Hawking, MD [facility] 09/22/2021, 2:57 PM  If 7PM-7AM, please contact night-coverage [email or web address]

## 2021-09-23 DIAGNOSIS — I5041 Acute combined systolic (congestive) and diastolic (congestive) heart failure: Secondary | ICD-10-CM

## 2021-09-23 LAB — BASIC METABOLIC PANEL
Anion gap: 6 (ref 5–15)
BUN: 21 mg/dL — ABNORMAL HIGH (ref 6–20)
CO2: 29 mmol/L (ref 22–32)
Calcium: 8.6 mg/dL — ABNORMAL LOW (ref 8.9–10.3)
Chloride: 106 mmol/L (ref 98–111)
Creatinine, Ser: 1.34 mg/dL — ABNORMAL HIGH (ref 0.61–1.24)
GFR, Estimated: >60 mL/min (ref >60)
Glucose, Bld: 87 mg/dL (ref 70–99)
Potassium: 3.6 mmol/L (ref 3.5–5.1)
Sodium: 141 mmol/L (ref 135–145)

## 2021-09-23 LAB — HEPATIC FUNCTION PANEL
ALT: 27 U/L (ref 0–44)
AST: 21 U/L (ref 15–41)
Albumin: 3.5 g/dL (ref 3.5–5.0)
Alkaline Phosphatase: 73 U/L (ref 38–126)
Bilirubin, Direct: 0.2 mg/dL (ref 0.0–0.2)
Indirect Bilirubin: 0.5 mg/dL (ref 0.3–0.9)
Total Bilirubin: 0.7 mg/dL (ref 0.3–1.2)
Total Protein: 6.2 g/dL — ABNORMAL LOW (ref 6.5–8.1)

## 2021-09-23 LAB — HEMOGLOBIN A1C
Hgb A1c MFr Bld: 5.5 % (ref 4.8–5.6)
Mean Plasma Glucose: 111.15 mg/dL

## 2021-09-23 MED ORDER — METOPROLOL SUCCINATE ER 25 MG PO TB24
12.5000 mg | ORAL_TABLET | Freq: Every day | ORAL | Status: DC
  Administered 2021-09-23 – 2021-09-24 (×2): 12.5 mg via ORAL
  Filled 2021-09-23 (×2): qty 1

## 2021-09-23 NOTE — Progress Notes (Signed)
Patient c/o feeling dizzy and diaphoretic. Obtained VS they are stable at this time. Patient started on metoprolol this shift, his HR 108. Will encourage PO fluids and monitor for changes.    09/23/21 1400  Vitals  BP (!) 130/104  MAP (mmHg) 110  BP Method Automatic  Pulse Rate (!) 108  Pulse Rate Source Monitor  Oxygen Therapy  SpO2 97 %  O2 Device Room [id number]

## 2021-09-23 NOTE — Progress Notes (Addendum)
PROGRESS NOTE    Michael Hester  KCL:[id number] DOB: 08/07/1966 DOA: 09/21/2021 PCP: Assunta Found, MD   Brief Narrative: Michael Hester is a 55 y.o. male with a history of hypertension, OSA. Patient presented secondary to shortness of breath with evidence of likely heart failure. No prior diagnosis of heart failure. IV lasix initiated. Transthoracic Echocardiogram significant for combined heart failure. Cardiology consulted but no plan for ischemic workup this hospitalization.   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 BNP of 1380. Flat/low troponin. No hypoxia but interstitial edema and pleural effusions noted on CT imaging. CT imaging also with evidence of possibly pulmonary artery hypertension with suggested tricuspid valve incompetence. No measured urine output documented over the last 24 hours, with several unmeasured occurrences documented. Weight is down about 500 g from admission. Transthoracic Echocardiogram obtained on 12/10 significant for EF of 25-30%. Creatinine is up with IV lasix -Transition to Lasix 40 mg daily PO -Cardiology consulted and will see in AM -Daily BMP -Daily weights/strict in and out -Continue Losartan and start Toprol XL today  Primary hypertension Patient was previously on lisinopril and hydrochlorothiazide as an outpatient but has discontinued their use. Blood pressure uncontrolled this admission. -Continue Losartan -Start Toprol XL  Bilateral pleural effusion Unsure of etiology but likely cardiogenic. CT imaging with evidence of a unspecified nodule. On room air but symptomatic. Diagnostic thoracentesis would be beneficial. -US thoracentesis (therapeutic and diagnostic)  Pulmonary nodule 12 mm RLL non-calcified nodule. Imaging differential includes granuloma vs malignant neoplasm. Patient will need to have short-term follow-up CT or PET-CT.  Morbid obesity (HCC) Body mass index  is 40.13 kg/m.  OSA (obstructive sleep apnea) Does not use CPAP at home. Ordered on admission.     DVT prophylaxis: Lovenox Code Status:   Code Status: Full Code Family Communication: None at bedside Disposition Plan: Discharge home in 1-3 days pending cardiology recommendations, thoracentesis   Consultants:  Cardiology  Procedures:  TRANSTHORACIC ECHOCARDIOGRAM (12/10) IMPRESSIONS     1. Left ventricular ejection fraction, by estimation, is 25 to 30%. The  left ventricle has severely decreased function. The left ventricle  demonstrates global hypokinesis. The left ventricular internal cavity size  was moderately dilated. Left  ventricular diastolic parameters are consistent with Grade II diastolic  dysfunction (pseudonormalization). Elevated left ventricular end-diastolic  pressure.   2. Right ventricular systolic function is moderately reduced. The right  ventricular size is mildly enlarged. Mildly increased right ventricular  wall thickness. There is moderately elevated pulmonary artery systolic  pressure.   3. Left atrial size was mildly dilated.   4. The mitral valve is normal in structure. Trivial mitral valve  regurgitation. No evidence of mitral stenosis.   5. The aortic valve is tricuspid. Aortic valve regurgitation is not  visualized. No aortic stenosis is present.   6. The inferior vena cava is normal in size with greater than 50%  respiratory variability, suggesting right atrial pressure of 3 mmHg.   Antimicrobials: None    Subjective: No significant issues overnight. Urinating frequently with Lasix. Some dyspnea with mobility.  Objective: Vitals:   09/22/21 1500 09/22/21 1600 09/22/21 2106 09/23/21 0526  BP: (!) 141/109 (!) 134/104 (!) 127/95 (!) 136/95  Pulse: (!) 116 (!) 110 (!) 110 (!) 109  Resp: 18  20 20   Temp: 97.9 F (36.6 C)  98.2 F (36.8 C) 98.5 F (36.9 C)  TempSrc: Oral   Oral  SpO2: 97%  98% 97%  Weight:    109 kg  Height:         Intake/Output Summary (Last 24 hours) at 09/23/2021 1159 Last data filed at 09/23/2021 0700 Gross per 24 hour  Intake 720 ml  Output --  Net 720 ml    Filed Weights   09/21/21 2200 09/22/21 0500 09/23/21 0526  Weight: 109.5 kg 109.4 kg 109 kg    Examination:  General exam: Appears calm and comfortable and in no acute distress. Conversant Respiratory: Clear to auscultation. Respiratory effort normal with no intercostal retractions or use of accessory muscles Cardiovascular: S1 & S2 heard, RRR. No murmurs, rubs, gallops or clicks. BLE edema (L>R) Gastrointestinal: Abdomen is protuberant, non-distended, soft and non-tender. No masses felt. Normal bowel sounds heard Neurologic: No focal neurological deficits Musculoskeletal: No calf tenderness Skin: No cyanosis. No new rashes Psychiatry: Alert and oriented. Memory intact. Mood & affect appropriate    Data Reviewed: I have personally reviewed following labs and imaging studies  CBC Lab Results  Component Value Date   WBC 9.4 09/22/2021   RBC 4.84 09/22/2021   HGB 14.6 09/22/2021   HCT 44.0 09/22/2021   MCV 90.9 09/22/2021   MCH 30.2 09/22/2021   PLT 312 09/22/2021   MCHC 33.2 09/22/2021   RDW 15.9 (H) 09/22/2021   LYMPHSABS 2.0 07/29/2016   MONOABS 0.6 07/29/2016   EOSABS 0.1 07/29/2016   BASOSABS 0.0 07/29/2016     Last metabolic panel Lab Results  Component Value Date   NA 141 09/23/2021   K 3.6 09/23/2021   CL 106 09/23/2021   CO2 29 09/23/2021   BUN 21 (H) 09/23/2021   CREATININE 1.34 (H) 09/23/2021   GLUCOSE 87 09/23/2021   GFRNONAA >60 09/23/2021   GFRAA >60 07/29/2016   CALCIUM 8.6 (L) 09/23/2021   PROT 6.9 09/22/2021   ALBUMIN 3.8 09/22/2021   BILITOT 1.2 09/22/2021   ALKPHOS 87 09/22/2021   AST 25 09/22/2021   ALT 35 09/22/2021   ANIONGAP 6 09/23/2021    CBG (last 3)  No results for input(s): GLUCAP in the last 72 hours.   GFR: Estimated Creatinine Clearance: 71.8 mL/min (A) (by C-G  formula based on SCr of 1.34 mg/dL (H)).  Coagulation Profile: No results for input(s): INR, PROTIME in the last 168 hours.  Recent Results (from the past 240 hour(s))  Resp Panel by RT-PCR (Flu A&B, Covid) Nasopharyngeal Swab     Status: None   Collection Time: 09/21/21  4:00 PM   Specimen: Nasopharyngeal Swab; Nasopharyngeal(NP) swabs in vial transport medium  Result Value Ref Range Status   SARS Coronavirus 2 by RT PCR NEGATIVE NEGATIVE Final    Comment: (NOTE) SARS-CoV-2 target nucleic acids are NOT DETECTED.  The SARS-CoV-2 RNA is generally detectable in upper respiratory specimens during the acute phase of infection. The lowest concentration of SARS-CoV-2 viral copies this assay can detect is 138 copies/mL. A negative result does not preclude SARS-Cov-2 infection and should not be used as the sole basis for treatment or other patient management decisions. A negative result may occur with  improper specimen collection/handling, submission of specimen other than nasopharyngeal swab, presence of viral mutation(s) within the areas targeted by this assay, and inadequate number of viral copies(<138 copies/mL). A negative result must be combined with clinical observations, patient history, and epidemiological information. The expected result is Negative.  Fact Sheet for Patients:  BloggerCourse.com  Fact Sheet for Healthcare Providers:  SeriousBroker.it  This test is no t yet  approved or cleared by the [place] and  has been authorized for detection and/or diagnosis of SARS-CoV-2 by FDA under an Emergency Use Authorization (EUA). This EUA will remain  in effect (meaning this test can be used) for the duration of the COVID-19 declaration under Section 564(b)(1) of the Act, 21 U.S.C.section 360bbb-3(b)(1), unless the authorization is terminated  or revoked sooner.       Influenza A by PCR NEGATIVE NEGATIVE Final    Influenza B by PCR NEGATIVE NEGATIVE Final    Comment: (NOTE) The Xpert Xpress SARS-CoV-2/FLU/RSV plus assay is intended as an aid in the diagnosis of influenza from Nasopharyngeal swab specimens and should not be used as a sole basis for treatment. Nasal washings and aspirates are unacceptable for Xpert Xpress SARS-CoV-2/FLU/RSV testing.  Fact Sheet for Patients: BloggerCourse.com  Fact Sheet for Healthcare Providers: SeriousBroker.it  This test is not yet approved or cleared by the [place] FDA and has been authorized for detection and/or diagnosis of SARS-CoV-2 by FDA under an Emergency Use Authorization (EUA). This EUA will remain in effect (meaning this test can be used) for the duration of the COVID-19 declaration under Section 564(b)(1) of the Act, 21 U.S.C. section 360bbb-3(b)(1), unless the authorization is terminated or revoked.  Performed at [facility], [address], [place], [place] [address]         Radiology Studies: DG Chest 2 View  Result Date: 09/21/2021 CLINICAL DATA:  Short of breath EXAM: CHEST - 2 VIEW COMPARISON:  07/29/2016 FINDINGS: Cardiac enlargement which has progressed in the interval. Vascular congestion with mild interstitial edema. No pleural effusion. No focal infiltrate. IMPRESSION: Cardiac enlargement with vascular congestion and mild interstitial edema compatible with heart failure. Electronically Signed   By: Marlan Palau M.D.   On: 09/21/2021 13:25   CT Angio Chest PE W and/or Wo Contrast  Result Date: 09/21/2021 CLINICAL DATA:  Shortness of breath EXAM: CT ANGIOGRAPHY CHEST WITH CONTRAST TECHNIQUE: Multidetector CT imaging of the chest was performed using the standard protocol during bolus administration of intravenous contrast. Multiplanar CT image reconstructions and MIPs were obtained to evaluate the vascular anatomy. CONTRAST:  OMNIPAQUE IOHEXOL 350 MG/ML SOLN COMPARISON:   Previous studies including the examination done earlier today FINDINGS: Cardiovascular: Heart is enlarged in size. There is reflux of contrast into hepatic veins suggesting tricuspid incompetence. Contrast density in thoracic aorta is less than adequate to evaluate the lumen. There are no intraluminal filling defects in the central pulmonary artery branches. There is ectasia of main pulmonary artery measuring 3.8 cm in diameter suggesting possible pulmonary arterial hypertension. Mediastinum/Nodes: There are subcentimeter nodes in mediastinum. Lungs/Pleura: There are subtle ground-glass densities in the parahilar regions. In image 106 of series 6, there is 12 mm noncalcified nodule in the right lower lobe. Upper Abdomen: Gallbladder stones are seen. Musculoskeletal: Unremarkable. Review of the MIP images confirms the above findings. IMPRESSION: There is no evidence of pulmonary artery embolism. Contrast density in the thoracic aorta is less than adequate to evaluate the lumen. There are ground-glass densities in the parahilar regions, possibly suggesting mild interstitial edema or interstitial pneumonitis. Small to moderate bilateral pleural effusions, more so on the right side. There is ectasia of main pulmonary artery suggesting pulmonary arterial hypertension. There is reflux of contrast into hepatic veins suggesting tricuspid incompetence. There is 12 mm noncalcified nodule in the right lower lobe. This may suggest granuloma or malignant neoplasm. Short-term follow-up CT and PET-CT if warranted may be considered. Gallbladder stones.  Electronically Signed   By: Ernie Avena M.D.   On: 09/21/2021 16:47   US Venous Img Lower Unilateral Left  Result Date: 09/21/2021 CLINICAL DATA:  LEFT lower extremity swelling. EXAM: LEFT LOWER EXTREMITY VENOUS DOPPLER ULTRASOUND TECHNIQUE: Gray-scale sonography with compression, as well as color and duplex ultrasound, were performed to evaluate the deep venous system(s)  from the level of the common femoral vein through the popliteal and proximal calf veins. COMPARISON:  Chest XR, concurrent. FINDINGS: VENOUS Normal compressibility of the LEFT common femoral, superficial femoral, and popliteal veins, as well as the visualized calf veins. Visualized portions of profunda femoral vein and great saphenous vein unremarkable. No filling defects to suggest DVT on grayscale or color Doppler imaging. Doppler waveforms show normal direction of venous flow, normal respiratory plasticity and response to augmentation. Limited views of the contralateral common femoral vein are unremarkable. OTHER No evidence of superficial thrombophlebitis or abnormal fluid collection. Limitations: none IMPRESSION: No femoropopliteal DVT within the LEFT lower extremity. Roanna Banning, MD [facility] Radiology Electronically Signed   By: Roanna Banning M.D.   On: 09/21/2021 14:51   ECHOCARDIOGRAM COMPLETE  Result Date: 09/22/2021    ECHOCARDIOGRAM REPORT   Patient Name:   Michael Hester Date of Exam: 09/22/2021 Medical Rec #:  [id number]        Height:       65.0 in Accession #:    [id number]       Weight:       241.2 lb Date of Birth:  September 02, 1966        BSA:          2.142 m Patient Age:    55 years         BP:           151/111 mmHg Patient Gender: M                HR:           115 bpm. [facility] Procedure: 2D Echo, Cardiac Doppler, Color Doppler and Intracardiac            Opacification Agent Indications:    Dyspnea  History:        Patient has no prior history of Echocardiogram examinations.                 CHF; Signs/Symptoms:Chest Pain.  Sonographer:    Mikki Harbor Referring Phys: [id number] DAWOOD Teena Irani  Sonographer Comments: Patient is morbidly obese. IMPRESSIONS  1. Left ventricular ejection fraction, by estimation, is 25 to 30%. The left ventricle has severely decreased function. The left ventricle demonstrates global hypokinesis.  The left ventricular internal cavity size was moderately dilated. Left ventricular diastolic parameters are consistent with Grade II diastolic dysfunction (pseudonormalization). Elevated left ventricular end-diastolic pressure.  2. Right ventricular systolic function is moderately reduced. The right ventricular size is mildly enlarged. Mildly increased right ventricular wall thickness. There is moderately elevated pulmonary artery systolic pressure.  3. Left atrial size was mildly dilated.  4. The mitral valve is normal in structure. Trivial mitral valve regurgitation. No evidence of mitral stenosis.  5. The aortic valve is tricuspid. Aortic valve regurgitation is not visualized. No aortic stenosis is present.  6. The inferior vena cava is normal in size with greater than 50% respiratory variability, suggesting right atrial pressure of 3 mmHg. FINDINGS  Left Ventricle: Left ventricular ejection fraction, by estimation, is 25 to 30%. The left  ventricle has severely decreased function. The left ventricle demonstrates global hypokinesis. Definity contrast agent was given IV to delineate the left ventricular endocardial borders. The left ventricular internal cavity size was moderately dilated. There is no left ventricular hypertrophy. Left ventricular diastolic parameters are consistent with Grade II diastolic dysfunction (pseudonormalization). Elevated left ventricular end-diastolic pressure. Right Ventricle: The right ventricular size is mildly enlarged. Mildly increased right ventricular wall thickness. Right ventricular systolic function is moderately reduced. There is moderately elevated pulmonary artery systolic pressure. The tricuspid regurgitant velocity is 3.32 m/s, and with an assumed right atrial pressure of 15 mmHg, the estimated right ventricular systolic pressure is 59.1 mmHg. Left Atrium: Left atrial size was mildly dilated. Right Atrium: Right atrial size was normal in size. Pericardium: There is no  evidence of pericardial effusion. Mitral Valve: The mitral valve is normal in structure. Trivial mitral valve regurgitation. No evidence of mitral valve stenosis. MV peak gradient, 5.6 mmHg. The mean mitral valve gradient is 2.0 mmHg. Tricuspid Valve: The tricuspid valve is normal in structure. Tricuspid valve regurgitation is mild . No evidence of tricuspid stenosis. Aortic Valve: The aortic valve is tricuspid. Aortic valve regurgitation is not visualized. No aortic stenosis is present. Aortic valve mean gradient measures 3.0 mmHg. Aortic valve peak gradient measures 4.8 mmHg. Aortic valve area, by VTI measures 1.89 cm. Pulmonic Valve: The pulmonic valve was normal in structure. Pulmonic valve regurgitation is not visualized. No evidence of pulmonic stenosis. Aorta: The aortic root is normal in size and structure. Venous: The inferior vena cava is normal in size with greater than 50% respiratory variability, suggesting right atrial pressure of 3 mmHg. IAS/Shunts: No atrial level shunt detected by color flow Doppler.  LEFT VENTRICLE PLAX 2D LVIDd:         6.10 cm   Diastology LVIDs:         5.30 cm   LV e' medial:    4.35 cm/s LV PW:         1.10 cm   LV E/e' medial:  18.8 LV IVS:        1.10 cm   LV e' lateral:   5.44 cm/s LVOT diam:     2.00 cm   LV E/e' lateral: 15.0 LV SV:         33 LV SV Index:   15 LVOT Area:     3.14 cm  RIGHT VENTRICLE RV Basal diam:  4.50 cm RV Mid diam:    3.40 cm RV S prime:     9.82 cm/s TAPSE (M-mode): 1.6 cm LEFT ATRIUM             Index        RIGHT ATRIUM           Index LA diam:        4.20 cm 1.96 cm/m   RA Area:     27.30 cm LA Vol (A2C):   93.3 ml 43.56 ml/m  RA Volume:   99.90 ml  46.64 ml/m LA Vol (A4C):   68.8 ml 32.12 ml/m LA Biplane Vol: 85.2 ml 39.78 ml/m  AORTIC VALVE                    PULMONIC VALVE AV Area (Vmax):    2.26 cm     PV Vmax:       0.50 m/s AV Area (Vmean):   1.85 cm     PV Peak grad:  1.0 mmHg AV Area (VTI):  1.89 cm AV Vmax:           110.00  cm/s AV Vmean:          79.600 cm/s AV VTI:            0.173 m AV Peak Grad:      4.8 mmHg AV Mean Grad:      3.0 mmHg LVOT Vmax:         79.00 cm/s LVOT Vmean:        46.800 cm/s LVOT VTI:          0.104 m LVOT/AV VTI ratio: 0.60  AORTA Ao Root diam: 3.30 cm Ao Asc diam:  3.20 cm MITRAL VALVE               TRICUSPID VALVE MV Area (PHT): 6.54 cm    TR Peak grad:   44.1 mmHg MV Area VTI:   1.81 cm    TR Vmax:        332.00 cm/s MV Peak grad:  5.6 mmHg MV Mean grad:  2.0 mmHg    SHUNTS MV Vmax:       1.18 m/s    Systemic VTI:  0.10 m MV Vmean:      68.1 cm/s   Systemic Diam: 2.00 cm MV Decel Time: 116 msec MV E velocity: 81.80 cm/s Charlton Haws MD Electronically signed by Charlton Haws MD Signature Date/Time: 09/22/2021/4:30:58 PM    Final         Scheduled Meds:  enoxaparin (LOVENOX) injection  40 mg Subcutaneous Q24H   furosemide  40 mg Oral Daily   losartan  25 mg Oral Daily   Continuous Infusions:   LOS: 1 day     Jacquelin [facility], MD [facility] 09/23/2021, 11:59 AM  If 7PM-7AM, please contact night-coverage [email or web address]

## 2021-09-23 NOTE — Plan of Care (Signed)

## 2021-09-23 NOTE — Progress Notes (Signed)
Patient states he was able to pass gas while ambulating and verbalized relief from feeling full in his abdomen.

## 2021-09-23 NOTE — Progress Notes (Signed)
Patient c/o feeling full in abdomen , and bloating , bladder scanned and showed 45ml. Notified Dr. Caleb Popp .

## 2021-09-24 ENCOUNTER — Encounter (HOSPITAL_COMMUNITY): Payer: Self-pay | Admitting: Internal Medicine

## 2021-09-24 ENCOUNTER — Inpatient Hospital Stay (HOSPITAL_COMMUNITY): Payer: Managed Care, Other (non HMO)

## 2021-09-24 DIAGNOSIS — I5082 Biventricular heart failure: Secondary | ICD-10-CM

## 2021-09-24 DIAGNOSIS — R000 Tachycardia, unspecified: Secondary | ICD-10-CM

## 2021-09-24 LAB — BASIC METABOLIC PANEL
Anion gap: 7 (ref 5–15)
BUN: 22 mg/dL — ABNORMAL HIGH (ref 6–20)
CO2: 23 mmol/L (ref 22–32)
Calcium: 8.7 mg/dL — ABNORMAL LOW (ref 8.9–10.3)
Chloride: 108 mmol/L (ref 98–111)
Creatinine, Ser: 1.23 mg/dL (ref 0.61–1.24)
GFR, Estimated: >60 mL/min (ref >60)
Glucose, Bld: 95 mg/dL (ref 70–99)
Potassium: 3.9 mmol/L (ref 3.5–5.1)
Sodium: 138 mmol/L (ref 135–145)

## 2021-09-24 LAB — LIPID PANEL
Cholesterol: 117 mg/dL (ref 0–200)
HDL: 34 mg/dL — ABNORMAL LOW (ref >40)
LDL Cholesterol: 67 mg/dL (ref 0–99)
Total CHOL/HDL Ratio: 3.4 RATIO
Triglycerides: 82 mg/dL (ref <150)
VLDL: 16 mg/dL (ref 0–40)

## 2021-09-24 LAB — LACTATE DEHYDROGENASE: LDH: 206 U/L — ABNORMAL HIGH (ref 98–192)

## 2021-09-24 LAB — TSH: TSH: 2.61 u[IU]/mL (ref 0.350–4.500)

## 2021-09-24 LAB — PROTEIN, TOTAL: Total Protein: 6.6 g/dL (ref 6.5–8.1)

## 2021-09-24 MED ORDER — ALUM & MAG HYDROXIDE-SIMETH 200-200-20 MG/5ML PO SUSP
15.0000 mL | ORAL | Status: DC | PRN
  Administered 2021-09-24: 22:00:00 15 mL via ORAL
  Filled 2021-09-24: qty 30

## 2021-09-24 MED ORDER — SODIUM CHLORIDE 0.9% FLUSH: 3.0000 mL | INTRAVENOUS | Status: DC | PRN

## 2021-09-24 MED ORDER — ASPIRIN 81 MG PO CHEW
81.0000 mg | CHEWABLE_TABLET | ORAL | Status: AC
  Administered 2021-09-25: 81 mg via ORAL
  Filled 2021-09-24: qty 1

## 2021-09-24 MED ORDER — POLYETHYLENE GLYCOL 3350 17 G PO PACK
17.0000 g | PACK | Freq: Every day | ORAL | Status: DC
  Filled 2021-09-24 (×3): qty 1

## 2021-09-24 MED ORDER — SODIUM CHLORIDE 0.9% FLUSH: 3.0000 mL | Freq: Two times a day (BID) | INTRAVENOUS | Status: DC

## 2021-09-24 MED ORDER — CARVEDILOL 6.25 MG PO TABS
6.2500 mg | ORAL_TABLET | Freq: Two times a day (BID) | ORAL | Status: DC
  Administered 2021-09-24 – 2021-09-27 (×6): 6.25 mg via ORAL
  Filled 2021-09-24: qty 1
  Filled 2021-09-24: qty 2
  Filled 2021-09-24 (×4): qty 1

## 2021-09-24 MED ORDER — SODIUM CHLORIDE 0.9 % IV SOLN: INTRAVENOUS | Status: DC

## 2021-09-24 MED ORDER — SODIUM CHLORIDE 0.9 % IV SOLN: 250.0000 mL | INTRAVENOUS | Status: DC | PRN

## 2021-09-24 NOTE — Consult Note (Addendum)
Cardiology Consultation:   Patient ID: Michael Hester MRN: [id number]; DOB: October 05, 1966  Admit date: 09/21/2021 Date of Consult: 09/24/2021  PCP:  Assunta Found, MD   [facility] Providers Cardiologist:  New to [facility]    Patient Profile:   Michael Hester is a 55 y.o. male with a hx of HTN & sleep apnea who is being seen 09/24/2021 for the evaluation of combined CHF at the request of Dr. Caleb Popp.  History of Present Illness:   Michael Hester is a 55 yo male with history of HTN, OSA admitted with SOB and new CHF. Echo 09/22/21 LVEF 25-30% with global HK grade 2 DD. Patient says he had a "virus" over Thanksgiving with a lot of malaise, nausea, URI, covid negative. Last week when he switched from night shift to days he had trouble sleeping. He then became smothered and DOE. He started swelling in legs and did a telehealth visit and was told to come to ED. No significant chest pain, has been inactive and gained 30 lbs over the past year. Has HTN - not optimally treated, family hx early CAD - father MI 14's, mother CHF, ? HLD, nonsmoker, no DM.  Patient saw Dr. Antoine Poche 2017 for atypical chest pain, suspected sleep apnea and fatigue. Labs and sleep study ordered.   Past Medical History:  Diagnosis Date   Hypertension    OSA (obstructive sleep apnea)     Past Surgical History:  Procedure Laterality Date   CYST EXCISION PERINEAL      Inpatient Medications: Scheduled Meds:  enoxaparin (LOVENOX) injection  40 mg Subcutaneous Q24H   furosemide  40 mg Oral Daily   losartan  25 mg Oral Daily   metoprolol succinate  12.5 mg Oral Daily   polyethylene glycol  17 g Oral Daily    PRN Meds: hydrALAZINE  Allergies:   No Known Allergies  Social History:   Social History   Tobacco Use   Smoking status: Never   Smokeless tobacco: Never  Substance Use Topics   Alcohol use: Never     Family History:     Family History  Problem Relation Age of Onset   Gout Mother    Diabetes  Father    Kidney failure Father      ROS:  Please see the history of present illness.  Review of Systems  Constitutional: Negative.  HENT: Negative.    Cardiovascular:  Positive for dyspnea on exertion and leg swelling.  Respiratory:  Positive for shortness of breath.   Endocrine: Negative.   Hematologic/Lymphatic: Negative.   Musculoskeletal: Negative.   Gastrointestinal: Negative.   Genitourinary: Negative.   Neurological: Negative.    All other ROS reviewed and negative.     Physical Exam/Data:   Vitals:   09/23/21 1400 09/23/21 2229 09/24/21 0500 09/24/21 0603  BP: (!) 130/104 (!) 130/94  (!) 140/100  Pulse: (!) 108 (!) 103  (!) 57  Resp:  20  17  Temp:  97.6 F (36.4 C)  (!) 97.5 F (36.4 C)  TempSrc:    Oral  SpO2: 97% 97%  94%  Weight:   110 kg   Height:        Intake/Output Summary (Last 24 hours) at 09/24/2021 0948 Last data filed at 09/24/2021 0827 Gross per 24 hour  Intake 240 ml  Output 575 ml  Net -335 ml   Last 3 Weights 09/24/2021 09/23/2021 09/22/2021  Weight (lbs) 242 lb 9.6 oz 240 lb 6.4 oz 241 lb 2.9 oz  Weight (kg) 110.043 kg 109.045 kg 109.4 kg     Body mass index is 40.37 kg/m.  General: Obese, in no acute distress  Neck: JVP 10 cmH2O Vascular: No carotid bruits; Distal pulses 2+ bilaterally Cardiac:  normal S1, S2; RRR; no murmur  Lungs: Decreased breath sounds at the bases, no wheezing, rhonchi or rales  Abd: soft, nontender, no hepatomegaly  Ext:  1+ bilateral leg edema Musculoskeletal:  No deformities, BUE and BLE strength normal and equal Skin: warm and dry  Neuro:  CNs 2-12 intact, no focal abnormalities noted Psych:  Normal affect   EKG:  The EKG was personally reviewed and demonstrates:  Sinus tachycardia Telemetry:  Telemetry was personally reviewed and demonstrates:  sinus tachycardia   Relevant CV Studies: Echo 09/22/21 IMPRESSIONS    1. Left ventricular ejection fraction, by estimation, is 25 to 30%. The  left  ventricle has severely decreased function. The left ventricle  demonstrates global hypokinesis. The left ventricular internal cavity size  was moderately dilated. Left  ventricular diastolic parameters are consistent with Grade II diastolic  dysfunction (pseudonormalization). Elevated left ventricular end-diastolic  pressure.   2. Right ventricular systolic function is moderately reduced. The right  ventricular size is mildly enlarged. Mildly increased right ventricular  wall thickness. There is moderately elevated pulmonary artery systolic  pressure.   3. Left atrial size was mildly dilated.   4. The mitral valve is normal in structure. Trivial mitral valve  regurgitation. No evidence of mitral stenosis.   5. The aortic valve is tricuspid. Aortic valve regurgitation is not  visualized. No aortic stenosis is present.   6. The inferior vena cava is normal in size with greater than 50%  respiratory variability, suggesting right atrial pressure of 3 mmHg.   Laboratory Data:  High Sensitivity Troponin:   Recent Labs  Lab 09/21/21 1314 09/21/21 1427  TROPONINIHS 33* 31*     Chemistry Recent Labs  Lab 09/21/21 1427 09/22/21 0545 09/23/21 0451 09/24/21 0505  NA  --  142 141 138  K  --  3.6 3.6 3.9  CL  --  109 106 108  CO2  --  GLUCOSE  --  106* 87 95  BUN  --  15 21* 22*  CREATININE  --  1.20 1.34* 1.23  CALCIUM  --  8.8* 8.6* 8.7*  MG 2.0  --   --   --   GFRNONAA  --  >60 >60 >60  ANIONGAP  --  Recent Labs  Lab 09/21/21 1314 09/22/21 0545 09/23/21 0545 09/24/21 0505  PROT 6.6 6.9 6.2* 6.6  ALBUMIN 3.7 3.8 3.5  --   AST --   ALT 40 35 27  --   ALKPHOS 88 87 73  --   BILITOT 0.8 1.2 0.7  --    Lipids  Recent Labs  Lab 09/24/21 0505  CHOL 117  TRIG 82  HDL 34*  LDLCALC 67  CHOLHDL 3.4    Hematology Recent Labs  Lab 09/21/21 1314 09/22/21 0545  WBC 10.2 9.4  RBC 4.79 4.84  HGB 14.4 14.6  HCT 43.4 44.0  MCV 90.6 90.9   MCH 30.1 30.2  MCHC 33.2 33.2  RDW 15.7* 15.9*  PLT 315 312   Thyroid  Recent Labs  Lab 09/24/21 0505  TSH 2.610    BNP Recent Labs  Lab 09/21/21 1314  BNP 1,380.0*    DDimer  Recent  Labs  Lab 09/21/21 1314  DDIMER 0.88*    Radiology/Studies:  DG Chest 2 View  Result Date: 09/21/2021 CLINICAL DATA:  Short of breath EXAM: CHEST - 2 VIEW COMPARISON:  07/29/2016 FINDINGS: Cardiac enlargement which has progressed in the interval. Vascular congestion with mild interstitial edema. No pleural effusion. No focal infiltrate. IMPRESSION: Cardiac enlargement with vascular congestion and mild interstitial edema compatible with heart failure. Electronically Signed   By: Marlan Palau M.D.   On: 09/21/2021 13:25   CT Angio Chest PE W and/or Wo Contrast  Result Date: 09/21/2021 CLINICAL DATA:  Shortness of breath EXAM: CT ANGIOGRAPHY CHEST WITH CONTRAST TECHNIQUE: Multidetector CT imaging of the chest was performed using the standard protocol during bolus administration of intravenous contrast. Multiplanar CT image reconstructions and MIPs were obtained to evaluate the vascular anatomy. CONTRAST:  OMNIPAQUE IOHEXOL 350 MG/ML SOLN COMPARISON:  Previous studies including the examination done earlier today FINDINGS: Cardiovascular: Heart is enlarged in size. There is reflux of contrast into hepatic veins suggesting tricuspid incompetence. Contrast density in thoracic aorta is less than adequate to evaluate the lumen. There are no intraluminal filling defects in the central pulmonary artery branches. There is ectasia of main pulmonary artery measuring 3.8 cm in diameter suggesting possible pulmonary arterial hypertension. Mediastinum/Nodes: There are subcentimeter nodes in mediastinum. Lungs/Pleura: There are subtle ground-glass densities in the parahilar regions. In image 106 of series 6, there is 12 mm noncalcified nodule in the right lower lobe. Upper Abdomen: Gallbladder stones are seen.  Musculoskeletal: Unremarkable. Review of the MIP images confirms the above findings. IMPRESSION: There is no evidence of pulmonary artery embolism. Contrast density in the thoracic aorta is less than adequate to evaluate the lumen. There are ground-glass densities in the parahilar regions, possibly suggesting mild interstitial edema or interstitial pneumonitis. Small to moderate bilateral pleural effusions, more so on the right side. There is ectasia of main pulmonary artery suggesting pulmonary arterial hypertension. There is reflux of contrast into hepatic veins suggesting tricuspid incompetence. There is 12 mm noncalcified nodule in the right lower lobe. This may suggest granuloma or malignant neoplasm. Short-term follow-up CT and PET-CT if warranted may be considered. Gallbladder stones. Electronically Signed   By: Ernie Avena M.D.   On: 09/21/2021 16:47   US Venous Img Lower Unilateral Left  Result Date: 09/21/2021 CLINICAL DATA:  LEFT lower extremity swelling. EXAM: LEFT LOWER EXTREMITY VENOUS DOPPLER ULTRASOUND TECHNIQUE: Gray-scale sonography with compression, as well as color and duplex ultrasound, were performed to evaluate the deep venous system(s) from the level of the common femoral vein through the popliteal and proximal calf veins. COMPARISON:  Chest XR, concurrent. FINDINGS: VENOUS Normal compressibility of the LEFT common femoral, superficial femoral, and popliteal veins, as well as the visualized calf veins. Visualized portions of profunda femoral vein and great saphenous vein unremarkable. No filling defects to suggest DVT on grayscale or color Doppler imaging. Doppler waveforms show normal direction of venous flow, normal respiratory plasticity and response to augmentation. Limited views of the contralateral common femoral vein are unremarkable. OTHER No evidence of superficial thrombophlebitis or abnormal fluid collection. Limitations: none IMPRESSION: No femoropopliteal DVT within  the LEFT lower extremity. Roanna Banning, MD [facility] Radiology Electronically Signed   By: Roanna Banning M.D.   On: 09/21/2021 14:51   ECHOCARDIOGRAM COMPLETE  Result Date: 09/22/2021    ECHOCARDIOGRAM REPORT   Patient Name:   Michael Hester Date of Exam: 09/22/2021 Medical Rec #:  [id number]        Height:       65.0 in Accession #:    [id number]       Weight:       241.2 lb Date of Birth:  08-27-1966        BSA:          2.142 m Patient Age:    55 years         BP:           151/111 mmHg Patient Gender: M                HR:           115 bpm. [facility] Procedure: 2D Echo, Cardiac Doppler, Color Doppler and Intracardiac            Opacification Agent Indications:    Dyspnea  History:        Patient has no prior history of Echocardiogram examinations.                 CHF; Signs/Symptoms:Chest Pain.  Sonographer:    Mikki Harbor Referring Phys: [id number] DAWOOD Teena Irani  Sonographer Comments: Patient is morbidly obese. IMPRESSIONS  1. Left ventricular ejection fraction, by estimation, is 25 to 30%. The left ventricle has severely decreased function. The left ventricle demonstrates global hypokinesis. The left ventricular internal cavity size was moderately dilated. Left ventricular diastolic parameters are consistent with Grade II diastolic dysfunction (pseudonormalization). Elevated left ventricular end-diastolic pressure.  2. Right ventricular systolic function is moderately reduced. The right ventricular size is mildly enlarged. Mildly increased right ventricular wall thickness. There is moderately elevated pulmonary artery systolic pressure.  3. Left atrial size was mildly dilated.  4. The mitral valve is normal in structure. Trivial mitral valve regurgitation. No evidence of mitral stenosis.  5. The aortic valve is tricuspid. Aortic valve regurgitation is not visualized. No aortic stenosis is present.  6. The inferior vena cava is normal  in size with greater than 50% respiratory variability, suggesting right atrial pressure of 3 mmHg. FINDINGS  Left Ventricle: Left ventricular ejection fraction, by estimation, is 25 to 30%. The left ventricle has severely decreased function. The left ventricle demonstrates global hypokinesis. Definity contrast agent was given IV to delineate the left ventricular endocardial borders. The left ventricular internal cavity size was moderately dilated. There is no left ventricular hypertrophy. Left ventricular diastolic parameters are consistent with Grade II diastolic dysfunction (pseudonormalization). Elevated left ventricular end-diastolic pressure. Right Ventricle: The right ventricular size is mildly enlarged. Mildly increased right ventricular wall thickness. Right ventricular systolic function is moderately reduced. There is moderately elevated pulmonary artery systolic pressure. The tricuspid regurgitant velocity is 3.32 m/s, and with an assumed right atrial pressure of 15 mmHg, the estimated right ventricular systolic pressure is 59.1 mmHg. Left Atrium: Left atrial size was mildly dilated. Right Atrium: Right atrial size was normal in size. Pericardium: There is no evidence of pericardial effusion. Mitral Valve: The mitral valve is normal in structure. Trivial mitral valve regurgitation. No evidence of mitral valve stenosis. MV peak gradient, 5.6 mmHg. The mean mitral valve gradient is 2.0 mmHg. Tricuspid Valve: The tricuspid valve is normal in structure. Tricuspid valve regurgitation is mild . No evidence of tricuspid stenosis. Aortic Valve: The aortic valve is tricuspid. Aortic valve regurgitation is not visualized. No aortic stenosis is present. Aortic valve mean gradient measures 3.0 mmHg. Aortic valve peak gradient measures 4.8 mmHg. Aortic valve area, by  VTI measures 1.89 cm. Pulmonic Valve: The pulmonic valve was normal in structure. Pulmonic valve regurgitation is not visualized. No evidence of pulmonic  stenosis. Aorta: The aortic root is normal in size and structure. Venous: The inferior vena cava is normal in size with greater than 50% respiratory variability, suggesting right atrial pressure of 3 mmHg. IAS/Shunts: No atrial level shunt detected by color flow Doppler.  LEFT VENTRICLE PLAX 2D LVIDd:         6.10 cm   Diastology LVIDs:         5.30 cm   LV e' medial:    4.35 cm/s LV PW:         1.10 cm   LV E/e' medial:  18.8 LV IVS:        1.10 cm   LV e' lateral:   5.44 cm/s LVOT diam:     2.00 cm   LV E/e' lateral: 15.0 LV SV:         33 LV SV Index:   15 LVOT Area:     3.14 cm  RIGHT VENTRICLE RV Basal diam:  4.50 cm RV Mid diam:    3.40 cm RV S prime:     9.82 cm/s TAPSE (M-mode): 1.6 cm LEFT ATRIUM             Index        RIGHT ATRIUM           Index LA diam:        4.20 cm 1.96 cm/m   RA Area:     27.30 cm LA Vol (A2C):   93.3 ml 43.56 ml/m  RA Volume:   99.90 ml  46.64 ml/m LA Vol (A4C):   68.8 ml 32.12 ml/m LA Biplane Vol: 85.2 ml 39.78 ml/m  AORTIC VALVE                    PULMONIC VALVE AV Area (Vmax):    2.26 cm     PV Vmax:       0.50 m/s AV Area (Vmean):   1.85 cm     PV Peak grad:  1.0 mmHg AV Area (VTI):     1.89 cm AV Vmax:           110.00 cm/s AV Vmean:          79.600 cm/s AV VTI:            0.173 m AV Peak Grad:      4.8 mmHg AV Mean Grad:      3.0 mmHg LVOT Vmax:         79.00 cm/s LVOT Vmean:        46.800 cm/s LVOT VTI:          0.104 m LVOT/AV VTI ratio: 0.60  AORTA Ao Root diam: 3.30 cm Ao Asc diam:  3.20 cm MITRAL VALVE               TRICUSPID VALVE MV Area (PHT): 6.54 cm    TR Peak grad:   44.1 mmHg MV Area VTI:   1.81 cm    TR Vmax:        332.00 cm/s MV Peak grad:  5.6 mmHg MV Mean grad:  2.0 mmHg    SHUNTS MV Vmax:       1.18 m/s    Systemic VTI:  0.10 m MV Vmean:      68.1 cm/s   Systemic Diam: 2.00 cm MV Decel  Time: 116 msec MV E velocity: 81.80 cm/s Charlton Haws MD Electronically signed by Charlton Haws MD Signature Date/Time: 09/22/2021/4:30:58 PM    Final       Assessment and Plan:   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 Recommend R/L heart cath to further assess. Patient agreeable. Will arrange transfer today and cath tomorrow. On losartan 25 mg daily, toprol XL 12.5 mg daily. Will increase Toprol or switch to coreg. Eventually transition losartan to entresto after cath. Consider spiro. Patient agreeable to R/L heart cath.  I have reviewed the risks, indications, and alternatives to angioplasty and stenting with the patient. Risks include but are not limited to bleeding, infection, vascular injury, stroke, myocardial infection, arrhythmia, kidney injury, radiation-related injury in the case of prolonged fluoroscopy use, emergency cardiac surgery, and death. The patient understands the risks of serious complication is low (<1%) and patient agrees to proceed.    Acute combined systolic and diastolic CHF-has diuresed 4.3 L since admission. Overall better compensated now on Lasix 40 mg po  HTN-borderline control-consider changing toprol to coreg.  Sinus tachycardia-increase BB  OSA on CPAP  Family hx early CAD-father MI 69's, CHF, mother CHF  Obesity-exercise and weight loss post discharge.  RLL lung nodule on CT will need f/u CT     Risk Assessment/Risk Scores:       New York Heart Association (NYHA) Functional Class NYHA Class III     For questions or updates, please contact [facility] Please consult [email or web address] for contact info under    Signed, Jacolyn Reedy, PA-C 09/24/2021 9:48 AM    Attending note:  Patient seen and examined.  I reviewed his records and testing so far, discussed situation with patient and family members in room.  Michael Hester presents with history of hypertension and OSA reporting worsening breathlessness at NYHA class III associated with fatigue, also recent leg swelling.  Reports having suspected viral infection around Thanksgiving with symptoms worse since that time.   No recent angina symptoms but does have family history of premature CAD.  He has worked night shift with recent transition to dayshift, not sleeping well.  Describes frank orthopnea.  On examination he is in no distress, anxious about situation.  Afebrile.  Heart rate 90-100 in sinus rhythm by telemetry, blood pressure 130/94.  He has diuresed approximately 4500 cc with Lasix since admission.  Lungs exhibit decreased breath sounds at the bases.  JVP 10 cmH2O.  Cardiac exam with RRR and indistinct PMI, no gallop.  He has 1+ leg edema.  Pertinent lab work includes sodium 138, potassium 3.9, BUN 22, creatinine 1.23, AST 21, ALT 27, BNP 1380, high-sensitivity troponin I levels flat in the 30s, LDL 67, hemoglobin 14.6, platelets 312, hemoglobin A1c 5.5%, influenza and COVID-19 negative.  Chest CTA shows no evidence of pulmonary embolus.  Mild interstitial edema present.  Small to moderate bilateral pleural effusions, right greater than left.  Main pulmonary artery ectatic and also reflux of contrast into the hepatic veins consistent with pulmonary hypertension.  12 mm noncalcified right lower lobe nodule.  Echocardiogram reveals biventricular dysfunction with LVEF 25 to 30%, global hypokinesis with moderately dilated left ventricle and moderate diastolic dysfunction, moderate RV dysfunction with moderate to severe pulmonary hypertension and estimated RVSP 59 mmHg.  Mild tricuspid regurgitation noted.  No significant pericardial effusion.  ECG shows sinus tachycardia with leftward axis and nonspecific T wave changes.  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  He does however have personal history of hypertension and OSA with family history of premature CAD.  No substantial coronary calcification by CT imaging.  After review of risks and benefits recommendation is to  proceed with a right and left heart catheterization, may ultimately need a cardiac MRI if no significant CAD is documented.  Can then continue to tailor cardiac regimen, currently on Toprol-XL and losartan.  After angiography would continue to round out GDMT (transition to Entresto, Aldactone, SGLT2 inhibitor) and could involve the heart failure team if necessary.  Jonelle Sidle, M.D., F.A.C.C.

## 2021-09-24 NOTE — TOC Initial Note (Signed)
Transition of Care [facility]) - Initial/Assessment Note    Patient Details  Name: DARRIEL UTTER MRN: [id number] Date of Birth: 10/28/1965  Transition of Care [facility]) CM/SW Contact:    Villa Herb, LCSWA Phone Number: 09/24/2021, 12:10 PM  Clinical Narrative:                 [facility] consulted for CHF screen. CSW spoke with pt to complete assessment. Pt states that he lives with his wife and 4 children. Pt is independent in completing his ADLs and provides his own transportation when needed. Pt has not had HH services nor does he use any DME.   CSW completed CHF screen with pt. Pt states that he will be weighing himself daily after d/c and following a heart healthy diet. Pt states that he has been provided a packet explaining the importance of these things and has read through and will follow the packet. Pt takes all medications as prescribed. Pt states he will likely be followed by Dr. Diona Browner in the outpatient setting for his cardiology needs.   Expected Discharge Plan: Home/Self Care Barriers to Discharge: Continued Medical Work up   Patient Goals and CMS Choice Patient states their goals for this hospitalization and ongoing recovery are:: Return home CMS Medicare.gov Compare Post Acute Care list provided to:: Patient Choice offered to / list presented to : Patient  Expected Discharge Plan and Services Expected Discharge Plan: Home/Self Care In-house Referral: Clinical Social Work Discharge Planning Services: CM Consult   Living arrangements for the past 2 months: Single Family Home                                      Prior Living Arrangements/Services Living arrangements for the past 2 months: Single Family Home Lives with:: Spouse, Minor Children Patient language and need for interpreter reviewed:: Yes Do you feel safe going back to the place where you live?: Yes      Need for Family Participation in Patient Care: Yes (Comment) Care giver support system in place?:  Yes (comment)   Criminal Activity/Legal Involvement Pertinent to Current Situation/Hospitalization: No - Comment as needed  Activities of Daily Living Home Assistive Devices/Equipment: None ADL Screening (condition at time of admission) Patient's cognitive ability adequate to safely complete daily activities?: Yes Is the patient deaf or have difficulty hearing?: No Does the patient have difficulty seeing, even when wearing glasses/contacts?: No Does the patient have difficulty concentrating, remembering, or making decisions?: No Patient able to express need for assistance with ADLs?: Yes Does the patient have difficulty dressing or bathing?: No Independently performs ADLs?: Yes (appropriate for developmental age) Does the patient have difficulty walking or climbing stairs?: No Weakness of Legs: None Weakness of Arms/Hands: None  Permission Sought/Granted                  Emotional Assessment Appearance:: Appears stated age Attitude/Demeanor/Rapport: Engaged Affect (typically observed): Accepting Orientation: : Oriented to Self, Oriented to Place, Oriented to  Time, Oriented to Situation Alcohol / Substance Use: Not Applicable Psych Involvement: No (comment)  Admission diagnosis:  Acute CHF (congestive heart failure) (HCC) [I50.9] Acute congestive heart failure, unspecified heart failure type (HCC) [I50.9] Acute CHF [facility]) [I50.9] Patient Active Problem List   Diagnosis Date Noted   Biventricular heart failure (HCC) 09/24/2021   Pulmonary nodule 09/22/2021   Bilateral pleural effusion 09/22/2021   Primary hypertension 09/22/2021  Acute CHF (HCC) 09/22/2021   Acute combined systolic and diastolic congestive heart failure (HCC) 09/21/2021   OSA (obstructive sleep apnea) 09/09/2016   Angina pectoris, crescendo (HCC) 09/09/2016   Palpitations 09/09/2016   Muscle twitching 09/09/2016   Snoring 09/09/2016   Morbid obesity (HCC) 09/09/2016   Pain in joint, shoulder region  07/26/2013   Muscle weakness (generalized) 07/26/2013   Capsulitis, adhesive shoulder 07/14/2013   PCP:  Assunta Found, MD Pharmacy:   [facility] [place] [place] - [address] [place] [address] Phone: [phone] Fax: [phone]     Social Determinants of Health (SDOH) Interventions    Readmission Risk Interventions Readmission Risk Prevention Plan 09/24/2021  Medication Screening Complete  Transportation Screening Complete  Some recent data might be hidden

## 2021-09-24 NOTE — Progress Notes (Signed)
Pt transferred to [facility]. Report called to receiving nurse. Transported by [facility]link. Pt said he has notified his wife of the transfer and new room. All pt belonging taken by patient.

## 2021-09-24 NOTE — Progress Notes (Signed)
PROGRESS NOTE    CURVIN HAGGAN  WTU:[id number] DOB: February 07, 1966 DOA: 09/21/2021 PCP: Assunta Found, MD   Brief Narrative: Suleyman AMUEL ROUSSELLE is a 55 y.o. male with a history of hypertension, OSA. Patient presented secondary to shortness of breath with evidence of likely heart failure. No prior diagnosis of heart failure. IV lasix initiated. Transthoracic Echocardiogram significant for combined heart failure. Cardiology consulted with initial plan for no inpatient ischemic workup, but now with recommendations for L/R heart catheterization. Patient to transfer to [facility]. Patient will transfer to the cardiology service for continued management.   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 BNP of 1380. Flat/low troponin. No hypoxia but interstitial edema and pleural effusions noted on CT imaging. CT imaging also with evidence of possibly pulmonary artery hypertension with suggested tricuspid valve incompetence. No measured urine output documented over the last 24 hours, with several unmeasured occurrences documented. Weight is down about 500 g from admission. Transthoracic Echocardiogram obtained on 12/10 significant for EF of 25-30%. Cardiology consulted with initial recommendation for no inpatient ischemic workup, however, now plan is for R/L heart catheterization -Continue Lasix 40 mg daily PO -Daily BMP -Daily weights/strict in and out -Continue Losartan and Toprol XL  Bilateral pleural effusion Unsure of etiology but likely cardiogenic. CT imaging with evidence of a unspecified nodule. On room air but symptomatic. Diagnostic thoracentesis would be beneficial. -US thoracentesis (therapeutic and diagnostic)  Biventricular heart failure (HCC) See problem, Acute combined systolic and diastolic congestive heart failure.  Primary hypertension Patient was previously on lisinopril and hydrochlorothiazide as an  outpatient but has discontinued their use. Blood pressure uncontrolled this admission. -Continue Losartan and Toprol XL  Pulmonary nodule 12 mm RLL non-calcified nodule. Imaging differential includes granuloma vs malignant neoplasm. Patient will need to have short-term follow-up CT or PET-CT.  Morbid obesity (HCC) Body mass index is 40.13 kg/m.  OSA (obstructive sleep apnea) Does not use CPAP at home. Ordered on admission.   Patient transferred to the cardiology service. TRH will sign off (12/12). Please re-consult if needed.  DVT prophylaxis: Lovenox Code Status:   Code Status: Full Code Family Communication: None at bedside Disposition Plan: Transfer to [facility]. Transfer to cardiology service 12/12.  Consultants:  Cardiology  Procedures:  TRANSTHORACIC ECHOCARDIOGRAM (12/10) IMPRESSIONS     1. Left ventricular ejection fraction, by estimation, is 25 to 30%. The  left ventricle has severely decreased function. The left ventricle  demonstrates global hypokinesis. The left ventricular internal cavity size  was moderately dilated. Left  ventricular diastolic parameters are consistent with Grade II diastolic  dysfunction (pseudonormalization). Elevated left ventricular end-diastolic  pressure.   2. Right ventricular systolic function is moderately reduced. The right  ventricular size is mildly enlarged. Mildly increased right ventricular  wall thickness. There is moderately elevated pulmonary artery systolic  pressure.   3. Left atrial size was mildly dilated.   4. The mitral valve is normal in structure. Trivial mitral valve  regurgitation. No evidence of mitral stenosis.   5. The aortic valve is tricuspid. Aortic valve regurgitation is not  visualized. No aortic stenosis is present.   6. The inferior vena cava is normal in size with greater than 50%  respiratory variability, suggesting right atrial pressure of 3 mmHg.   Antimicrobials: None     Subjective: Some abdominal discomfort yesterday that improved after passing gas. Still with some dyspnea with mobility.  Objective: Vitals:  09/23/21 1400 09/23/21 2229 09/24/21 0500 09/24/21 0603  BP: (!) 130/104 (!) 130/94  (!) 140/100  Pulse: (!) 108 (!) 103  (!) 57  Resp:  20  17  Temp:  97.6 F (36.4 C)  (!) 97.5 F (36.4 C)  TempSrc:    Oral  SpO2: 97% 97%  94%  Weight:   110 kg   Height:        Intake/Output Summary (Last 24 hours) at 09/24/2021 1006 Last data filed at 09/24/2021 0827 Gross per 24 hour  Intake 240 ml  Output 575 ml  Net -335 ml    Filed Weights   09/22/21 0500 09/23/21 0526 09/24/21 0500  Weight: 109.4 kg 109 kg 110 kg    Examination:  General exam: Appears calm and comfortable and in no acute distress. Conversant Respiratory: Clear to auscultation. Respiratory effort normal with no intercostal retractions or use of accessory muscles Cardiovascular: S1 & S2 heard, RRR. No murmurs, rubs, gallops or clicks. BLE 1+ pitting edema Gastrointestinal: Abdomen is protuberant, soft and non-tender. No masses felt. Normal bowel sounds heard Neurologic: No focal neurological deficits Musculoskeletal: No calf tenderness Skin: No cyanosis. No new rashes Psychiatry: Alert and oriented. Memory intact. Mood & affect appropriate    Data Reviewed: I have personally reviewed following labs and imaging studies  CBC Lab Results  Component Value Date   WBC 9.4 09/22/2021   RBC 4.84 09/22/2021   HGB 14.6 09/22/2021   HCT 44.0 09/22/2021   MCV 90.9 09/22/2021   MCH 30.2 09/22/2021   PLT 312 09/22/2021   MCHC 33.2 09/22/2021   RDW 15.9 (H) 09/22/2021   LYMPHSABS 2.0 07/29/2016   MONOABS 0.6 07/29/2016   EOSABS 0.1 07/29/2016   BASOSABS 0.0 07/29/2016     Last metabolic panel Lab Results  Component Value Date   NA 138 09/24/2021   K 3.9 09/24/2021   CL 108 09/24/2021   CO2 23 09/24/2021   BUN 22 (H) 09/24/2021   CREATININE 1.23 09/24/2021    GLUCOSE 95 09/24/2021   GFRNONAA >60 09/24/2021   GFRAA >60 07/29/2016   CALCIUM 8.7 (L) 09/24/2021   PROT 6.6 09/24/2021   ALBUMIN 3.5 09/23/2021   BILITOT 0.7 09/23/2021   ALKPHOS 73 09/23/2021   AST 21 09/23/2021   ALT 27 09/23/2021   ANIONGAP 7 09/24/2021    CBG (last 3)  No results for input(s): GLUCAP in the last 72 hours.   GFR: Estimated Creatinine Clearance: 78.6 mL/min (by C-G formula based on SCr of 1.23 mg/dL).  Coagulation Profile: No results for input(s): INR, PROTIME in the last 168 hours.  Recent Results (from the past 240 hour(s))  Resp Panel by RT-PCR (Flu A&B, Covid) Nasopharyngeal Swab     Status: None   Collection Time: 09/21/21  4:00 PM   Specimen: Nasopharyngeal Swab; Nasopharyngeal(NP) swabs in vial transport medium  Result Value Ref Range Status   SARS Coronavirus 2 by RT PCR NEGATIVE NEGATIVE Final    Comment: (NOTE) SARS-CoV-2 target nucleic acids are NOT DETECTED.  The SARS-CoV-2 RNA is generally detectable in upper respiratory specimens during the acute phase of infection. The lowest concentration of SARS-CoV-2 viral copies this assay can detect is 138 copies/mL. A negative result does not preclude SARS-Cov-2 infection and should not be used as the sole basis for treatment or other patient management decisions. A negative result may occur with  improper specimen collection/handling, submission of specimen other than nasopharyngeal swab, presence of viral mutation(s) within the areas  targeted by this assay, and inadequate number of viral copies(<138 copies/mL). A negative result must be combined with clinical observations, patient history, and epidemiological information. The expected result is Negative.  Fact Sheet for Patients:  BloggerCourse.com  Fact Sheet for Healthcare Providers:  SeriousBroker.it  This test is no t yet approved or cleared by the [place] FDA and  has been  authorized for detection and/or diagnosis of SARS-CoV-2 by FDA under an Emergency Use Authorization (EUA). This EUA will remain  in effect (meaning this test can be used) for the duration of the COVID-19 declaration under Section 564(b)(1) of the Act, 21 U.S.C.section 360bbb-3(b)(1), unless the authorization is terminated  or revoked sooner.       Influenza A by PCR NEGATIVE NEGATIVE Final   Influenza B by PCR NEGATIVE NEGATIVE Final    Comment: (NOTE) The Xpert Xpress SARS-CoV-2/FLU/RSV plus assay is intended as an aid in the diagnosis of influenza from Nasopharyngeal swab specimens and should not be used as a sole basis for treatment. Nasal washings and aspirates are unacceptable for Xpert Xpress SARS-CoV-2/FLU/RSV testing.  Fact Sheet for Patients: BloggerCourse.com  Fact Sheet for Healthcare Providers: SeriousBroker.it  This test is not yet approved or cleared by the [place] FDA and has been authorized for detection and/or diagnosis of SARS-CoV-2 by FDA under an Emergency Use Authorization (EUA). This EUA will remain in effect (meaning this test can be used) for the duration of the COVID-19 declaration under Section 564(b)(1) of the Act, 21 U.S.C. section 360bbb-3(b)(1), unless the authorization is terminated or revoked.  Performed at [facility], [address], [place], [place] [address]         Radiology Studies: ECHOCARDIOGRAM COMPLETE  Result Date: 09/22/2021    ECHOCARDIOGRAM REPORT   Patient Name:   NAREN BENALLY Date of Exam: 09/22/2021 Medical Rec #:  [id number]        Height:       65.0 in Accession #:    [id number]       Weight:       241.2 lb Date of Birth:  05/21/1966        BSA:          2.142 m Patient Age:    55 years         BP:           151/111 mmHg Patient Gender: M                HR:           115 bpm. [facility]am [facility] Procedure: 2D Echo, Cardiac Doppler, Color Doppler and  Intracardiac            Opacification Agent Indications:    Dyspnea  History:        Patient has no prior history of Echocardiogram examinations.                 CHF; Signs/Symptoms:Chest Pain.  Sonographer:    Mikki Harbor Referring Phys: [id number] DAWOOD Teena Irani  Sonographer Comments: Patient is morbidly obese. IMPRESSIONS  1. Left ventricular ejection fraction, by estimation, is 25 to 30%. The left ventricle has severely decreased function. The left ventricle demonstrates global hypokinesis. The left ventricular internal cavity size was moderately dilated. Left ventricular diastolic parameters are consistent with Grade II diastolic dysfunction (pseudonormalization). Elevated left ventricular end-diastolic pressure.  2. Right ventricular systolic function is moderately reduced. The right ventricular size is mildly enlarged. Mildly increased right ventricular  wall thickness. There is moderately elevated pulmonary artery systolic pressure.  3. Left atrial size was mildly dilated.  4. The mitral valve is normal in structure. Trivial mitral valve regurgitation. No evidence of mitral stenosis.  5. The aortic valve is tricuspid. Aortic valve regurgitation is not visualized. No aortic stenosis is present.  6. The inferior vena cava is normal in size with greater than 50% respiratory variability, suggesting right atrial pressure of 3 mmHg. FINDINGS  Left Ventricle: Left ventricular ejection fraction, by estimation, is 25 to 30%. The left ventricle has severely decreased function. The left ventricle demonstrates global hypokinesis. Definity contrast agent was given IV to delineate the left ventricular endocardial borders. The left ventricular internal cavity size was moderately dilated. There is no left ventricular hypertrophy. Left ventricular diastolic parameters are consistent with Grade II diastolic dysfunction (pseudonormalization). Elevated left ventricular end-diastolic pressure. Right Ventricle: The right  ventricular size is mildly enlarged. Mildly increased right ventricular wall thickness. Right ventricular systolic function is moderately reduced. There is moderately elevated pulmonary artery systolic pressure. The tricuspid regurgitant velocity is 3.32 m/s, and with an assumed right atrial pressure of 15 mmHg, the estimated right ventricular systolic pressure is 59.1 mmHg. Left Atrium: Left atrial size was mildly dilated. Right Atrium: Right atrial size was normal in size. Pericardium: There is no evidence of pericardial effusion. Mitral Valve: The mitral valve is normal in structure. Trivial mitral valve regurgitation. No evidence of mitral valve stenosis. MV peak gradient, 5.6 mmHg. The mean mitral valve gradient is 2.0 mmHg. Tricuspid Valve: The tricuspid valve is normal in structure. Tricuspid valve regurgitation is mild . No evidence of tricuspid stenosis. Aortic Valve: The aortic valve is tricuspid. Aortic valve regurgitation is not visualized. No aortic stenosis is present. Aortic valve mean gradient measures 3.0 mmHg. Aortic valve peak gradient measures 4.8 mmHg. Aortic valve area, by VTI measures 1.89 cm. Pulmonic Valve: The pulmonic valve was normal in structure. Pulmonic valve regurgitation is not visualized. No evidence of pulmonic stenosis. Aorta: The aortic root is normal in size and structure. Venous: The inferior vena cava is normal in size with greater than 50% respiratory variability, suggesting right atrial pressure of 3 mmHg. IAS/Shunts: No atrial level shunt detected by color flow Doppler.  LEFT VENTRICLE PLAX 2D LVIDd:         6.10 cm   Diastology LVIDs:         5.30 cm   LV e' medial:    4.35 cm/s LV PW:         1.10 cm   LV E/e' medial:  18.8 LV IVS:        1.10 cm   LV e' lateral:   5.44 cm/s LVOT diam:     2.00 cm   LV E/e' lateral: 15.0 LV SV:         33 LV SV Index:   15 LVOT Area:     3.14 cm  RIGHT VENTRICLE RV Basal diam:  4.50 cm RV Mid diam:    3.40 cm RV S prime:     9.82 cm/s  TAPSE (M-mode): 1.6 cm LEFT ATRIUM             Index        RIGHT ATRIUM           Index LA diam:        4.20 cm 1.96 cm/m   RA Area:     27.30 cm LA Vol (A2C):   93.3 ml 43.56  ml/m  RA Volume:   99.90 ml  46.64 ml/m LA Vol (A4C):   68.8 ml 32.12 ml/m LA Biplane Vol: 85.2 ml 39.78 ml/m  AORTIC VALVE                    PULMONIC VALVE AV Area (Vmax):    2.26 cm     PV Vmax:       0.50 m/s AV Area (Vmean):   1.85 cm     PV Peak grad:  1.0 mmHg AV Area (VTI):     1.89 cm AV Vmax:           110.00 cm/s AV Vmean:          79.600 cm/s AV VTI:            0.173 m AV Peak Grad:      4.8 mmHg AV Mean Grad:      3.0 mmHg LVOT Vmax:         79.00 cm/s LVOT Vmean:        46.800 cm/s LVOT VTI:          0.104 m LVOT/AV VTI ratio: 0.60  AORTA Ao Root diam: 3.30 cm Ao Asc diam:  3.20 cm MITRAL VALVE               TRICUSPID VALVE MV Area (PHT): 6.54 cm    TR Peak grad:   44.1 mmHg MV Area VTI:   1.81 cm    TR Vmax:        332.00 cm/s MV Peak grad:  5.6 mmHg MV Mean grad:  2.0 mmHg    SHUNTS MV Vmax:       1.18 m/s    Systemic VTI:  0.10 m MV Vmean:      68.1 cm/s   Systemic Diam: 2.00 cm MV Decel Time: 116 msec MV E velocity: 81.80 cm/s Charlton Haws MD Electronically signed by Charlton Haws MD Signature Date/Time: 09/22/2021/4:30:58 PM    Final         Scheduled Meds:  enoxaparin (LOVENOX) injection  40 mg Subcutaneous Q24H   furosemide  40 mg Oral Daily   losartan  25 mg Oral Daily   metoprolol succinate  12.5 mg Oral Daily   polyethylene glycol  17 g Oral Daily   Continuous Infusions:   LOS: 2 days     Jacquelin [facility]king, MD [facility] 09/24/2021, 10:06 AM  If 7PM-7AM, please contact night-coverage [email or web address]

## 2021-09-24 NOTE — H&P (View-Only) (Signed)
Cardiology Consultation:   Patient ID: Michael Hester MRN: [id number]; DOB: October 05, 1966  Admit date: 09/21/2021 Date of Consult: 09/24/2021  PCP:  Assunta Found, MD   [facility] Providers Cardiologist:  New to [facility]    Patient Profile:   Michael Hester is a 55 y.o. male with a hx of HTN & sleep apnea who is being seen 09/24/2021 for the evaluation of combined CHF at the request of Dr. Caleb Popp.  History of Present Illness:   Michael Hester is a 55 yo male with history of HTN, OSA admitted with SOB and new CHF. Echo 09/22/21 LVEF 25-30% with global HK grade 2 DD. Patient says he had a "virus" over Thanksgiving with a lot of malaise, nausea, URI, covid negative. Last week when he switched from night shift to days he had trouble sleeping. He then became smothered and DOE. He started swelling in legs and did a telehealth visit and was told to come to ED. No significant chest pain, has been inactive and gained 30 lbs over the past year. Has HTN - not optimally treated, family hx early CAD - father MI 14's, mother CHF, ? HLD, nonsmoker, no DM.  Patient saw Dr. Antoine Poche 2017 for atypical chest pain, suspected sleep apnea and fatigue. Labs and sleep study ordered.   Past Medical History:  Diagnosis Date   Hypertension    OSA (obstructive sleep apnea)     Past Surgical History:  Procedure Laterality Date   CYST EXCISION PERINEAL      Inpatient Medications: Scheduled Meds:  enoxaparin (LOVENOX) injection  40 mg Subcutaneous Q24H   furosemide  40 mg Oral Daily   losartan  25 mg Oral Daily   metoprolol succinate  12.5 mg Oral Daily   polyethylene glycol  17 g Oral Daily    PRN Meds: hydrALAZINE  Allergies:   No Known Allergies  Social History:   Social History   Tobacco Use   Smoking status: Never   Smokeless tobacco: Never  Substance Use Topics   Alcohol use: Never     Family History:     Family History  Problem Relation Age of Onset   Gout Mother    Diabetes  Father    Kidney failure Father      ROS:  Please see the history of present illness.  Review of Systems  Constitutional: Negative.  HENT: Negative.    Cardiovascular:  Positive for dyspnea on exertion and leg swelling.  Respiratory:  Positive for shortness of breath.   Endocrine: Negative.   Hematologic/Lymphatic: Negative.   Musculoskeletal: Negative.   Gastrointestinal: Negative.   Genitourinary: Negative.   Neurological: Negative.    All other ROS reviewed and negative.     Physical Exam/Data:   Vitals:   09/23/21 1400 09/23/21 2229 09/24/21 0500 09/24/21 0603  BP: (!) 130/104 (!) 130/94  (!) 140/100  Pulse: (!) 108 (!) 103  (!) 57  Resp:  20  17  Temp:  97.6 F (36.4 C)  (!) 97.5 F (36.4 C)  TempSrc:    Oral  SpO2: 97% 97%  94%  Weight:   110 kg   Height:        Intake/Output Summary (Last 24 hours) at 09/24/2021 0948 Last data filed at 09/24/2021 0827 Gross per 24 hour  Intake 240 ml  Output 575 ml  Net -335 ml   Last 3 Weights 09/24/2021 09/23/2021 09/22/2021  Weight (lbs) 242 lb 9.6 oz 240 lb 6.4 oz 241 lb 2.9 oz  Weight (kg) 110.043 kg 109.045 kg 109.4 kg     Body mass index is 40.37 kg/m.  General: Obese, in no acute distress  Neck: JVP 10 cmH2O Vascular: No carotid bruits; Distal pulses 2+ bilaterally Cardiac:  normal S1, S2; RRR; no murmur  Lungs: Decreased breath sounds at the bases, no wheezing, rhonchi or rales  Abd: soft, nontender, no hepatomegaly  Ext:  1+ bilateral leg edema Musculoskeletal:  No deformities, BUE and BLE strength normal and equal Skin: warm and dry  Neuro:  CNs 2-12 intact, no focal abnormalities noted Psych:  Normal affect   EKG:  The EKG was personally reviewed and demonstrates:  Sinus tachycardia Telemetry:  Telemetry was personally reviewed and demonstrates:  sinus tachycardia   Relevant CV Studies: Echo 09/22/21 IMPRESSIONS    1. Left ventricular ejection fraction, by estimation, is 25 to 30%. The  left  ventricle has severely decreased function. The left ventricle  demonstrates global hypokinesis. The left ventricular internal cavity size  was moderately dilated. Left  ventricular diastolic parameters are consistent with Grade II diastolic  dysfunction (pseudonormalization). Elevated left ventricular end-diastolic  pressure.   2. Right ventricular systolic function is moderately reduced. The right  ventricular size is mildly enlarged. Mildly increased right ventricular  wall thickness. There is moderately elevated pulmonary artery systolic  pressure.   3. Left atrial size was mildly dilated.   4. The mitral valve is normal in structure. Trivial mitral valve  regurgitation. No evidence of mitral stenosis.   5. The aortic valve is tricuspid. Aortic valve regurgitation is not  visualized. No aortic stenosis is present.   6. The inferior vena cava is normal in size with greater than 50%  respiratory variability, suggesting right atrial pressure of 3 mmHg.   Laboratory Data:  High Sensitivity Troponin:   Recent Labs  Lab 09/21/21 1314 09/21/21 1427  TROPONINIHS 33* 31*     Chemistry Recent Labs  Lab 09/21/21 1427 09/22/21 0545 09/23/21 0451 09/24/21 0505  NA  --  142 141 138  K  --  3.6 3.6 3.9  CL  --  109 106 108  CO2  --  GLUCOSE  --  106* 87 95  BUN  --  15 21* 22*  CREATININE  --  1.20 1.34* 1.23  CALCIUM  --  8.8* 8.6* 8.7*  MG 2.0  --   --   --   GFRNONAA  --  >60 >60 >60  ANIONGAP  --  Recent Labs  Lab 09/21/21 1314 09/22/21 0545 09/23/21 0545 09/24/21 0505  PROT 6.6 6.9 6.2* 6.6  ALBUMIN 3.7 3.8 3.5  --   AST --   ALT 40 35 27  --   ALKPHOS 88 87 73  --   BILITOT 0.8 1.2 0.7  --    Lipids  Recent Labs  Lab 09/24/21 0505  CHOL 117  TRIG 82  HDL 34*  LDLCALC 67  CHOLHDL 3.4    Hematology Recent Labs  Lab 09/21/21 1314 09/22/21 0545  WBC 10.2 9.4  RBC 4.79 4.84  HGB 14.4 14.6  HCT 43.4 44.0  MCV 90.6 90.9   MCH 30.1 30.2  MCHC 33.2 33.2  RDW 15.7* 15.9*  PLT 315 312   Thyroid  Recent Labs  Lab 09/24/21 0505  TSH 2.610    BNP Recent Labs  Lab 09/21/21 1314  BNP 1,380.0*    DDimer  Recent  Labs  Lab 09/21/21 1314  DDIMER 0.88*    Radiology/Studies:  DG Chest 2 View  Result Date: 09/21/2021 CLINICAL DATA:  Short of breath EXAM: CHEST - 2 VIEW COMPARISON:  07/29/2016 FINDINGS: Cardiac enlargement which has progressed in the interval. Vascular congestion with mild interstitial edema. No pleural effusion. No focal infiltrate. IMPRESSION: Cardiac enlargement with vascular congestion and mild interstitial edema compatible with heart failure. Electronically Signed   By: Marlan Palau M.D.   On: 09/21/2021 13:25   CT Angio Chest PE W and/or Wo Contrast  Result Date: 09/21/2021 CLINICAL DATA:  Shortness of breath EXAM: CT ANGIOGRAPHY CHEST WITH CONTRAST TECHNIQUE: Multidetector CT imaging of the chest was performed using the standard protocol during bolus administration of intravenous contrast. Multiplanar CT image reconstructions and MIPs were obtained to evaluate the vascular anatomy. CONTRAST:  OMNIPAQUE IOHEXOL 350 MG/ML SOLN COMPARISON:  Previous studies including the examination done earlier today FINDINGS: Cardiovascular: Heart is enlarged in size. There is reflux of contrast into hepatic veins suggesting tricuspid incompetence. Contrast density in thoracic aorta is less than adequate to evaluate the lumen. There are no intraluminal filling defects in the central pulmonary artery branches. There is ectasia of main pulmonary artery measuring 3.8 cm in diameter suggesting possible pulmonary arterial hypertension. Mediastinum/Nodes: There are subcentimeter nodes in mediastinum. Lungs/Pleura: There are subtle ground-glass densities in the parahilar regions. In image 106 of series 6, there is 12 mm noncalcified nodule in the right lower lobe. Upper Abdomen: Gallbladder stones are seen.  Musculoskeletal: Unremarkable. Review of the MIP images confirms the above findings. IMPRESSION: There is no evidence of pulmonary artery embolism. Contrast density in the thoracic aorta is less than adequate to evaluate the lumen. There are ground-glass densities in the parahilar regions, possibly suggesting mild interstitial edema or interstitial pneumonitis. Small to moderate bilateral pleural effusions, more so on the right side. There is ectasia of main pulmonary artery suggesting pulmonary arterial hypertension. There is reflux of contrast into hepatic veins suggesting tricuspid incompetence. There is 12 mm noncalcified nodule in the right lower lobe. This may suggest granuloma or malignant neoplasm. Short-term follow-up CT and PET-CT if warranted may be considered. Gallbladder stones. Electronically Signed   By: Ernie Avena M.D.   On: 09/21/2021 16:47   US Venous Img Lower Unilateral Left  Result Date: 09/21/2021 CLINICAL DATA:  LEFT lower extremity swelling. EXAM: LEFT LOWER EXTREMITY VENOUS DOPPLER ULTRASOUND TECHNIQUE: Gray-scale sonography with compression, as well as color and duplex ultrasound, were performed to evaluate the deep venous system(s) from the level of the common femoral vein through the popliteal and proximal calf veins. COMPARISON:  Chest XR, concurrent. FINDINGS: VENOUS Normal compressibility of the LEFT common femoral, superficial femoral, and popliteal veins, as well as the visualized calf veins. Visualized portions of profunda femoral vein and great saphenous vein unremarkable. No filling defects to suggest DVT on grayscale or color Doppler imaging. Doppler waveforms show normal direction of venous flow, normal respiratory plasticity and response to augmentation. Limited views of the contralateral common femoral vein are unremarkable. OTHER No evidence of superficial thrombophlebitis or abnormal fluid collection. Limitations: none IMPRESSION: No femoropopliteal DVT within  the LEFT lower extremity. Roanna Banning, MD [facility] Radiology Electronically Signed   By: Roanna Banning M.D.   On: 09/21/2021 14:51   ECHOCARDIOGRAM COMPLETE  Result Date: 09/22/2021    ECHOCARDIOGRAM REPORT   Patient Name:   Michael Hester Date of Exam: 09/22/2021 Medical Rec #:  [id number]        Height:       65.0 in Accession #:    [id number]       Weight:       241.2 lb Date of Birth:  08-27-1966        BSA:          2.142 m Patient Age:    55 years         BP:           151/111 mmHg Patient Gender: M                HR:           115 bpm. [facility] Procedure: 2D Echo, Cardiac Doppler, Color Doppler and Intracardiac            Opacification Agent Indications:    Dyspnea  History:        Patient has no prior history of Echocardiogram examinations.                 CHF; Signs/Symptoms:Chest Pain.  Sonographer:    Mikki Harbor Referring Phys: [id number] DAWOOD Teena Irani  Sonographer Comments: Patient is morbidly obese. IMPRESSIONS  1. Left ventricular ejection fraction, by estimation, is 25 to 30%. The left ventricle has severely decreased function. The left ventricle demonstrates global hypokinesis. The left ventricular internal cavity size was moderately dilated. Left ventricular diastolic parameters are consistent with Grade II diastolic dysfunction (pseudonormalization). Elevated left ventricular end-diastolic pressure.  2. Right ventricular systolic function is moderately reduced. The right ventricular size is mildly enlarged. Mildly increased right ventricular wall thickness. There is moderately elevated pulmonary artery systolic pressure.  3. Left atrial size was mildly dilated.  4. The mitral valve is normal in structure. Trivial mitral valve regurgitation. No evidence of mitral stenosis.  5. The aortic valve is tricuspid. Aortic valve regurgitation is not visualized. No aortic stenosis is present.  6. The inferior vena cava is normal  in size with greater than 50% respiratory variability, suggesting right atrial pressure of 3 mmHg. FINDINGS  Left Ventricle: Left ventricular ejection fraction, by estimation, is 25 to 30%. The left ventricle has severely decreased function. The left ventricle demonstrates global hypokinesis. Definity contrast agent was given IV to delineate the left ventricular endocardial borders. The left ventricular internal cavity size was moderately dilated. There is no left ventricular hypertrophy. Left ventricular diastolic parameters are consistent with Grade II diastolic dysfunction (pseudonormalization). Elevated left ventricular end-diastolic pressure. Right Ventricle: The right ventricular size is mildly enlarged. Mildly increased right ventricular wall thickness. Right ventricular systolic function is moderately reduced. There is moderately elevated pulmonary artery systolic pressure. The tricuspid regurgitant velocity is 3.32 m/s, and with an assumed right atrial pressure of 15 mmHg, the estimated right ventricular systolic pressure is 59.1 mmHg. Left Atrium: Left atrial size was mildly dilated. Right Atrium: Right atrial size was normal in size. Pericardium: There is no evidence of pericardial effusion. Mitral Valve: The mitral valve is normal in structure. Trivial mitral valve regurgitation. No evidence of mitral valve stenosis. MV peak gradient, 5.6 mmHg. The mean mitral valve gradient is 2.0 mmHg. Tricuspid Valve: The tricuspid valve is normal in structure. Tricuspid valve regurgitation is mild . No evidence of tricuspid stenosis. Aortic Valve: The aortic valve is tricuspid. Aortic valve regurgitation is not visualized. No aortic stenosis is present. Aortic valve mean gradient measures 3.0 mmHg. Aortic valve peak gradient measures 4.8 mmHg. Aortic valve area, by  VTI measures 1.89 cm. Pulmonic Valve: The pulmonic valve was normal in structure. Pulmonic valve regurgitation is not visualized. No evidence of pulmonic  stenosis. Aorta: The aortic root is normal in size and structure. Venous: The inferior vena cava is normal in size with greater than 50% respiratory variability, suggesting right atrial pressure of 3 mmHg. IAS/Shunts: No atrial level shunt detected by color flow Doppler.  LEFT VENTRICLE PLAX 2D LVIDd:         6.10 cm   Diastology LVIDs:         5.30 cm   LV e' medial:    4.35 cm/s LV PW:         1.10 cm   LV E/e' medial:  18.8 LV IVS:        1.10 cm   LV e' lateral:   5.44 cm/s LVOT diam:     2.00 cm   LV E/e' lateral: 15.0 LV SV:         33 LV SV Index:   15 LVOT Area:     3.14 cm  RIGHT VENTRICLE RV Basal diam:  4.50 cm RV Mid diam:    3.40 cm RV S prime:     9.82 cm/s TAPSE (M-mode): 1.6 cm LEFT ATRIUM             Index        RIGHT ATRIUM           Index LA diam:        4.20 cm 1.96 cm/m   RA Area:     27.30 cm LA Vol (A2C):   93.3 ml 43.56 ml/m  RA Volume:   99.90 ml  46.64 ml/m LA Vol (A4C):   68.8 ml 32.12 ml/m LA Biplane Vol: 85.2 ml 39.78 ml/m  AORTIC VALVE                    PULMONIC VALVE AV Area (Vmax):    2.26 cm     PV Vmax:       0.50 m/s AV Area (Vmean):   1.85 cm     PV Peak grad:  1.0 mmHg AV Area (VTI):     1.89 cm AV Vmax:           110.00 cm/s AV Vmean:          79.600 cm/s AV VTI:            0.173 m AV Peak Grad:      4.8 mmHg AV Mean Grad:      3.0 mmHg LVOT Vmax:         79.00 cm/s LVOT Vmean:        46.800 cm/s LVOT VTI:          0.104 m LVOT/AV VTI ratio: 0.60  AORTA Ao Root diam: 3.30 cm Ao Asc diam:  3.20 cm MITRAL VALVE               TRICUSPID VALVE MV Area (PHT): 6.54 cm    TR Peak grad:   44.1 mmHg MV Area VTI:   1.81 cm    TR Vmax:        332.00 cm/s MV Peak grad:  5.6 mmHg MV Mean grad:  2.0 mmHg    SHUNTS MV Vmax:       1.18 m/s    Systemic VTI:  0.10 m MV Vmean:      68.1 cm/s   Systemic Diam: 2.00 cm MV Decel  Time: 116 msec MV E velocity: 81.80 cm/s Charlton Haws MD Electronically signed by Charlton Haws MD Signature Date/Time: 09/22/2021/4:30:58 PM    Final       Assessment and Plan:   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 Recommend R/L heart cath to further assess. Patient agreeable. Will arrange transfer today and cath tomorrow. On losartan 25 mg daily, toprol XL 12.5 mg daily. Will increase Toprol or switch to coreg. Eventually transition losartan to entresto after cath. Consider spiro. Patient agreeable to R/L heart cath.  I have reviewed the risks, indications, and alternatives to angioplasty and stenting with the patient. Risks include but are not limited to bleeding, infection, vascular injury, stroke, myocardial infection, arrhythmia, kidney injury, radiation-related injury in the case of prolonged fluoroscopy use, emergency cardiac surgery, and death. The patient understands the risks of serious complication is low (<1%) and patient agrees to proceed.    Acute combined systolic and diastolic CHF-has diuresed 4.3 L since admission. Overall better compensated now on Lasix 40 mg po  HTN-borderline control-consider changing toprol to coreg.  Sinus tachycardia-increase BB  OSA on CPAP  Family hx early CAD-father MI 69's, CHF, mother CHF  Obesity-exercise and weight loss post discharge.  RLL lung nodule on CT will need f/u CT     Risk Assessment/Risk Scores:       New York Heart Association (NYHA) Functional Class NYHA Class III     For questions or updates, please contact [facility] Please consult [email or web address] for contact info under    Signed, Jacolyn Reedy, PA-C 09/24/2021 9:48 AM    Attending note:  Patient seen and examined.  I reviewed his records and testing so far, discussed situation with patient and family members in room.  Michael Hester presents with history of hypertension and OSA reporting worsening breathlessness at NYHA class III associated with fatigue, also recent leg swelling.  Reports having suspected viral infection around Thanksgiving with symptoms worse since that time.   No recent angina symptoms but does have family history of premature CAD.  He has worked night shift with recent transition to dayshift, not sleeping well.  Describes frank orthopnea.  On examination he is in no distress, anxious about situation.  Afebrile.  Heart rate 90-100 in sinus rhythm by telemetry, blood pressure 130/94.  He has diuresed approximately 4500 cc with Lasix since admission.  Lungs exhibit decreased breath sounds at the bases.  JVP 10 cmH2O.  Cardiac exam with RRR and indistinct PMI, no gallop.  He has 1+ leg edema.  Pertinent lab work includes sodium 138, potassium 3.9, BUN 22, creatinine 1.23, AST 21, ALT 27, BNP 1380, high-sensitivity troponin I levels flat in the 30s, LDL 67, hemoglobin 14.6, platelets 312, hemoglobin A1c 5.5%, influenza and COVID-19 negative.  Chest CTA shows no evidence of pulmonary embolus.  Mild interstitial edema present.  Small to moderate bilateral pleural effusions, right greater than left.  Main pulmonary artery ectatic and also reflux of contrast into the hepatic veins consistent with pulmonary hypertension.  12 mm noncalcified right lower lobe nodule.  Echocardiogram reveals biventricular dysfunction with LVEF 25 to 30%, global hypokinesis with moderately dilated left ventricle and moderate diastolic dysfunction, moderate RV dysfunction with moderate to severe pulmonary hypertension and estimated RVSP 59 mmHg.  Mild tricuspid regurgitation noted.  No significant pericardial effusion.  ECG shows sinus tachycardia with leftward axis and nonspecific T wave changes.  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  He does however have personal history of hypertension and OSA with family history of premature CAD.  No substantial coronary calcification by CT imaging.  After review of risks and benefits recommendation is to  proceed with a right and left heart catheterization, may ultimately need a cardiac MRI if no significant CAD is documented.  Can then continue to tailor cardiac regimen, currently on Toprol-XL and losartan.  After angiography would continue to round out GDMT (transition to Entresto, Aldactone, SGLT2 inhibitor) and could involve the heart failure team if necessary.  Ekaterini Capitano G. Judyth Demarais, M.D., F.A.C.C.  

## 2021-09-24 NOTE — Assessment & Plan Note (Addendum)
See problem, Acute combined systolic and diastolic congestive heart failure.

## 2021-09-24 NOTE — Plan of Care (Signed)
  Problem: Education: Goal: Ability to demonstrate management of disease process will improve Outcome: Progressing Goal: Ability to verbalize understanding of medication therapies will improve Outcome: Progressing   

## 2021-09-24 NOTE — Plan of Care (Signed)

## 2021-09-25 ENCOUNTER — Encounter (HOSPITAL_COMMUNITY): Payer: Self-pay | Admitting: Internal Medicine

## 2021-09-25 ENCOUNTER — Other Ambulatory Visit (HOSPITAL_COMMUNITY): Payer: Self-pay

## 2021-09-25 ENCOUNTER — Inpatient Hospital Stay (HOSPITAL_COMMUNITY)
Admission: EM | Disposition: A | Discharge status: Home / Self Care | Payer: Self-pay | Source: Home / Self Care | Attending: Family Medicine

## 2021-09-25 DIAGNOSIS — I2510 Atherosclerotic heart disease of native coronary artery without angina pectoris: Secondary | ICD-10-CM

## 2021-09-25 HISTORY — PX: RIGHT/LEFT HEART CATH AND CORONARY ANGIOGRAPHY: CATH118266

## 2021-09-25 LAB — POCT I-STAT 7, (LYTES, BLD GAS, ICA,H+H)
Acid-base deficit: 1 mmol/L (ref 0.0–2.0)
Acid-base deficit: 2 mmol/L (ref 0.0–2.0)
Bicarbonate: 22.3 mmol/L (ref 20.0–28.0)
Bicarbonate: 24.7 mmol/L (ref 20.0–28.0)
Calcium, Ion: 0.91 mmol/L — ABNORMAL LOW (ref 1.15–1.40)
Calcium, Ion: 0.98 mmol/L — ABNORMAL LOW (ref 1.15–1.40)
HCT: 36 % — ABNORMAL LOW (ref 39.0–52.0)
HCT: 36 % — ABNORMAL LOW (ref 39.0–52.0)
Hemoglobin: 12.2 g/dL — ABNORMAL LOW (ref 13.0–17.0)
Hemoglobin: 12.2 g/dL — ABNORMAL LOW (ref 13.0–17.0)
O2 Saturation: 78 %
O2 Saturation: 99 %
Potassium: 3.1 mmol/L — ABNORMAL LOW (ref 3.5–5.1)
Potassium: 3.1 mmol/L — ABNORMAL LOW (ref 3.5–5.1)
Sodium: 144 mmol/L (ref 135–145)
Sodium: 146 mmol/L — ABNORMAL HIGH (ref 135–145)
TCO2: 23 mmol/L (ref 22–32)
TCO2: 26 mmol/L (ref 22–32)
pCO2 arterial: 34.9 mmHg (ref 32.0–48.0)
pCO2 arterial: 45.1 mmHg (ref 32.0–48.0)
pH, Arterial: 7.347 — ABNORMAL LOW (ref 7.350–7.450)
pH, Arterial: 7.414 (ref 7.350–7.450)
pO2, Arterial: 136 mmHg — ABNORMAL HIGH (ref 83.0–108.0)
pO2, Arterial: 45 mmHg — ABNORMAL LOW (ref 83.0–108.0)

## 2021-09-25 LAB — POCT I-STAT EG7
Acid-Base Excess: 1 mmol/L (ref 0.0–2.0)
Acid-base deficit: 4 mmol/L — ABNORMAL HIGH (ref 0.0–2.0)
Bicarbonate: 21.1 mmol/L (ref 20.0–28.0)
Bicarbonate: 26.3 mmol/L (ref 20.0–28.0)
Calcium, Ion: 0.74 mmol/L — CL (ref 1.15–1.40)
Calcium, Ion: 1.09 mmol/L — ABNORMAL LOW (ref 1.15–1.40)
HCT: 33 % — ABNORMAL LOW (ref 39.0–52.0)
HCT: 40 % (ref 39.0–52.0)
Hemoglobin: 11.2 g/dL — ABNORMAL LOW (ref 13.0–17.0)
Hemoglobin: 13.6 g/dL (ref 13.0–17.0)
O2 Saturation: 67 %
O2 Saturation: 71 %
Potassium: 2.5 mmol/L — CL (ref 3.5–5.1)
Potassium: 3.5 mmol/L (ref 3.5–5.1)
Sodium: 143 mmol/L (ref 135–145)
Sodium: 150 mmol/L — ABNORMAL HIGH (ref 135–145)
TCO2: 22 mmol/L (ref 22–32)
TCO2: 28 mmol/L (ref 22–32)
pCO2, Ven: 37.4 mmHg — ABNORMAL LOW (ref 44.0–60.0)
pCO2, Ven: 44.9 mmHg (ref 44.0–60.0)
pH, Ven: 7.359 (ref 7.250–7.430)
pH, Ven: 7.375 (ref 7.250–7.430)
pO2, Ven: 36 mmHg (ref 32.0–45.0)
pO2, Ven: 39 mmHg (ref 32.0–45.0)

## 2021-09-25 LAB — BASIC METABOLIC PANEL
Anion gap: 10 (ref 5–15)
BUN: 25 mg/dL — ABNORMAL HIGH (ref 6–20)
CO2: 21 mmol/L — ABNORMAL LOW (ref 22–32)
Calcium: 8.6 mg/dL — ABNORMAL LOW (ref 8.9–10.3)
Chloride: 104 mmol/L (ref 98–111)
Creatinine, Ser: 1.49 mg/dL — ABNORMAL HIGH (ref 0.61–1.24)
GFR, Estimated: 55 mL/min — ABNORMAL LOW (ref >60)
Glucose, Bld: 98 mg/dL (ref 70–99)
Potassium: 3.6 mmol/L (ref 3.5–5.1)
Sodium: 135 mmol/L (ref 135–145)

## 2021-09-25 SURGERY — RIGHT/LEFT HEART CATH AND CORONARY ANGIOGRAPHY
Anesthesia: LOCAL

## 2021-09-25 MED ORDER — LIDOCAINE HCL (PF) 1 % IJ SOLN
INTRAMUSCULAR | Status: DC | PRN
  Administered 2021-09-25: 3 mL

## 2021-09-25 MED ORDER — ENOXAPARIN SODIUM 40 MG/0.4ML IJ SOSY
40.0000 mg | PREFILLED_SYRINGE | INTRAMUSCULAR | Status: DC
  Administered 2021-09-26 – 2021-09-27 (×2): 40 mg via SUBCUTANEOUS
  Filled 2021-09-25 (×2): qty 0.4

## 2021-09-25 MED ORDER — HEPARIN (PORCINE) IN NACL 1000-0.9 UT/500ML-% IV SOLN
INTRAVENOUS | Status: AC
  Filled 2021-09-25: qty 500

## 2021-09-25 MED ORDER — HEPARIN SODIUM (PORCINE) 1000 UNIT/ML IJ SOLN
INTRAMUSCULAR | Status: DC | PRN
  Administered 2021-09-25: 5000 [IU] via INTRAVENOUS

## 2021-09-25 MED ORDER — SODIUM CHLORIDE 0.9% FLUSH
3.0000 mL | Freq: Two times a day (BID) | INTRAVENOUS | Status: DC
  Administered 2021-09-25 – 2021-09-27 (×5): 3 mL via INTRAVENOUS

## 2021-09-25 MED ORDER — DIGOXIN 125 MCG PO TABS
0.1250 mg | ORAL_TABLET | Freq: Every day | ORAL | Status: DC
  Administered 2021-09-25 – 2021-09-27 (×3): 0.125 mg via ORAL
  Filled 2021-09-25 (×3): qty 1

## 2021-09-25 MED ORDER — FENTANYL CITRATE (PF) 100 MCG/2ML IJ SOLN
INTRAMUSCULAR | Status: AC
  Filled 2021-09-25: qty 2

## 2021-09-25 MED ORDER — HEPARIN (PORCINE) IN NACL 1000-0.9 UT/500ML-% IV SOLN
INTRAVENOUS | Status: DC | PRN
  Administered 2021-09-25 (×2): 500 mL

## 2021-09-25 MED ORDER — SODIUM CHLORIDE 0.9% FLUSH: 3.0000 mL | INTRAVENOUS | Status: DC | PRN

## 2021-09-25 MED ORDER — FUROSEMIDE 10 MG/ML IJ SOLN
INTRAMUSCULAR | Status: DC | PRN
  Administered 2021-09-25: 80 mg via INTRAVENOUS

## 2021-09-25 MED ORDER — ACETAMINOPHEN 325 MG PO TABS: 650.0000 mg | ORAL_TABLET | ORAL | Status: DC | PRN

## 2021-09-25 MED ORDER — LIDOCAINE HCL (PF) 1 % IJ SOLN
INTRAMUSCULAR | Status: AC
  Filled 2021-09-25: qty 30

## 2021-09-25 MED ORDER — SODIUM CHLORIDE 0.9 % IV SOLN: 250.0000 mL | INTRAVENOUS | Status: DC | PRN

## 2021-09-25 MED ORDER — LABETALOL HCL 5 MG/ML IV SOLN: 10.0000 mg | INTRAVENOUS | Status: AC | PRN

## 2021-09-25 MED ORDER — IOHEXOL 350 MG/ML SOLN
INTRAVENOUS | Status: DC | PRN
  Administered 2021-09-25: 70 mL

## 2021-09-25 MED ORDER — VERAPAMIL HCL 2.5 MG/ML IV SOLN
INTRAVENOUS | Status: DC | PRN
  Administered 2021-09-25: 10 mL via INTRA_ARTERIAL

## 2021-09-25 MED ORDER — SPIRONOLACTONE 12.5 MG HALF TABLET
12.5000 mg | ORAL_TABLET | Freq: Every day | ORAL | Status: DC
  Administered 2021-09-25 – 2021-09-27 (×3): 12.5 mg via ORAL
  Filled 2021-09-25 (×3): qty 1

## 2021-09-25 MED ORDER — HEPARIN SODIUM (PORCINE) 1000 UNIT/ML IJ SOLN
INTRAMUSCULAR | Status: AC
  Filled 2021-09-25: qty 10

## 2021-09-25 MED ORDER — ONDANSETRON HCL 4 MG/2ML IJ SOLN: 4.0000 mg | Freq: Four times a day (QID) | INTRAMUSCULAR | Status: DC | PRN

## 2021-09-25 MED ORDER — VERAPAMIL HCL 2.5 MG/ML IV SOLN
INTRAVENOUS | Status: AC
  Filled 2021-09-25: qty 2

## 2021-09-25 MED ORDER — SACUBITRIL-VALSARTAN 24-26 MG PO TABS
1.0000 | ORAL_TABLET | Freq: Two times a day (BID) | ORAL | Status: DC
  Administered 2021-09-25: 1 via ORAL
  Filled 2021-09-25 (×3): qty 1

## 2021-09-25 MED ORDER — MIDAZOLAM HCL 2 MG/2ML IJ SOLN
INTRAMUSCULAR | Status: AC
  Filled 2021-09-25: qty 2

## 2021-09-25 MED ORDER — FENTANYL CITRATE (PF) 100 MCG/2ML IJ SOLN
INTRAMUSCULAR | Status: DC | PRN
  Administered 2021-09-25: 25 ug via INTRAVENOUS

## 2021-09-25 MED ORDER — MIDAZOLAM HCL 2 MG/2ML IJ SOLN
INTRAMUSCULAR | Status: DC | PRN
  Administered 2021-09-25: 1 mg via INTRAVENOUS

## 2021-09-25 MED ORDER — FUROSEMIDE 10 MG/ML IJ SOLN
INTRAMUSCULAR | Status: AC
  Filled 2021-09-25: qty 8

## 2021-09-25 MED ORDER — HYDRALAZINE HCL 20 MG/ML IJ SOLN: 10.0000 mg | INTRAMUSCULAR | Status: AC | PRN

## 2021-09-25 MED ORDER — FUROSEMIDE 10 MG/ML IJ SOLN
80.0000 mg | Freq: Two times a day (BID) | INTRAMUSCULAR | Status: DC
  Administered 2021-09-25 – 2021-09-26 (×2): 80 mg via INTRAVENOUS
  Filled 2021-09-25 (×2): qty 8

## 2021-09-25 SURGICAL SUPPLY — 9 items
CATH 5FR JL3.5 JR4 ANG PIG MP (CATHETERS) ×1 IMPLANT
CATH BALLN WEDGE 5F 110CM (CATHETERS) ×1 IMPLANT
DEVICE RAD COMP TR BAND LRG (VASCULAR PRODUCTS) ×1 IMPLANT
GLIDESHEATH SLEND SS 6F .021 (SHEATH) ×1 IMPLANT
GUIDEWIRE INQWIRE 1.5J.035X260 (WIRE) IMPLANT
INQWIRE 1.5J .035X260CM (WIRE) ×2
PACK CARDIAC CATHETERIZATION (CUSTOM PROCEDURE TRAY) ×2 IMPLANT
SHEATH GLIDE SLENDER 4/5FR (SHEATH) ×1 IMPLANT
TRANSDUCER W/STOPCOCK (MISCELLANEOUS) ×2 IMPLANT

## 2021-09-25 NOTE — Consult Note (Signed)
Advanced Heart Failure Team Consult Note   Primary Physician: Sharilyn Sites, MD PCP-Cardiologist:  None Referring: Dr. Domenic Polite  Reason for Consultation: Acute systolic HF  HPI:    Michael Hester is seen today for evaluation of acute systolic HF  at the request of [facility].   Michael Hester is a 55 yo male with history of HTN, OSA admitted to [facility] over the last few days with SOB and new CHF. Echo 09/22/21 LVEF 25-30% with global HK grade 2 DD. Patient says he had a "virus" over Thanksgiving with a lot of malaise, nausea, URI, covid negative. Last week when he switched from night shift to days he had trouble sleeping due to orthopnea and PND. He did a telehealth visit and was told to come to ED. No significant chest pain, has been inactive and gained 30 lbs over the past year. Has HTN - not optimally treated, family hx early CAD - father MI 11's, mother CHF, ? HLD, nonsmoker, no DM.   Hstrop 31, 33  ECG: Sinus tach 125 No ST-T wave abnormalities. Narrow QRS Personally reviewed    Review of Systems: [y] = yes, [ ]  = no   General: Weight gain [ y]; Weight loss [ ] ; Anorexia [ ] ; Fatigue Blue.Reese ]; Fever [ ] ; Chills [ ] ; Weakness [ ]   Cardiac: Chest pain/pressure [ ] ; Resting SOB [ ] ; Exertional SOB Blue.Reese ]; Orthopnea Blue.Reese ]; Pedal Edema [ y]; Palpitations [ ] ; Syncope [ ] ; Presyncope [ ] ; Paroxysmal nocturnal dyspnea[ ]   Pulmonary: Cough [ ] ; Wheezing[ ] ; Hemoptysis[ ] ; Sputum [ ] ; Snoring Blue.Reese ]  GI: Vomiting[ ] ; Dysphagia[ ] ; Melena[ ] ; Hematochezia [ ] ; Heartburn[ ] ; Abdominal pain [ ] ; Constipation [ ] ; Diarrhea [ ] ; BRBPR [ ]   GU: Hematuria[ ] ; Dysuria [ ] ; Nocturia[ ]   Vascular: Pain in legs with walking [ ] ; Pain in feet with lying flat [ ] ; Non-healing sores [ ] ; Stroke [ ] ; TIA [ ] ; Slurred speech [ ] ;  Neuro: Headaches[ ] ; Vertigo[ ] ; Seizures[ ] ; Paresthesias[ ] ;Blurred vision [ ] ; Diplopia [ ] ; Vision changes [ ]   Ortho/Skin: Arthritis [ y]; Joint pain [ y]; Muscle pain [ ] ; Joint  swelling [ ] ; Back Pain [ ] ; Rash [ ]   Psych: Depression[ ] ; Anxiety[ ]   Heme: Bleeding problems [ ] ; Clotting disorders [ ] ; Anemia [ ]   Endocrine: Diabetes [ ] ; Thyroid dysfunction[ ]   Home Medications Prior to Admission medications   Not on File    Past Medical History: Past Medical History:  Diagnosis Date   Hypertension    OSA (obstructive sleep apnea)     Past Surgical History: Past Surgical History:  Procedure Laterality Date   CYST EXCISION PERINEAL      Family History: Family History  Problem Relation Age of Onset   Gout Mother    Diabetes Father    Kidney failure Father     Social History: Social History   Socioeconomic History   Marital status: Married    Spouse name: Not on file   Number of children: 4   Years of education: Not on file   Highest education level: Not on file  Occupational History   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     Social Determinants of Health   Financial Resource Strain: Not on file  Food Insecurity: Not on file  Transportation Needs: Not on file  Physical Activity: Not on file  Stress: Not on file  Social Connections: Not on file    Allergies:  No Known Allergies  Objective:    Vital Signs:   Temp:  [97 F (36.1 C)-98.7 F (37.1 C)] 97.7 F (36.5 C) (12/13 0720) Pulse Rate:  [98-109] 98 (12/13 0720) Resp:  [18-20] 18 (12/13 0720) BP: (103-147)/(79-115) 132/112 (12/13 0720) SpO2:  [93 %-99 %] 93 % (12/13 0720) Weight:  [109.5 kg-110.4 kg] 109.5 kg (12/13 0007) Last BM Date:  (t)  Weight change: Filed Weights   09/24/21 0500 09/24/21 2100 09/25/21 0007  Weight: 110 kg 110.4 kg 109.5 kg    Intake/Output:   Intake/Output Summary (Last  24 hours) at 09/25/2021 0848 Last data filed at 09/25/2021 0418 Gross per 24 hour  Intake 780 ml  Output --  Net 780 ml      Physical Exam    General:  Lying in bed + cough HEENT: normal Neck: supple. JVP to ear  . Carotids 2+ bilat; no bruits. No lymphadenopathy or thyromegaly appreciated. Cor: PMI nondisplaced. Regular tachy + s3 No rubs, gallops or murmurs. Lungs: + crackles at bases Abdomen: soft, nontender, nondistended. No hepatosplenomegaly. No bruits or masses. Good bowel sounds. Extremities: no cyanosis, clubbing, rash, 1-2+ edema Neuro: alert & orientedx3, cranial nerves grossly intact. moves all 4 extremities w/o difficulty. Affect pleasant   Telemetry   Sinus tach 110-120 Personally reviewed   EKG    Sinus tach 125 No ST-T wave abnormalities. Narrow QRS Personally reviewed   Labs   Basic Metabolic Panel: Recent Labs  Lab 09/21/21 1314 09/21/21 1427 09/22/21 0545 09/23/21 0451 09/24/21 0505 09/25/21 0040  NA 139  --  142 141 138 135  K 3.4*  --  3.6 3.6 3.9 3.6  CL 109  --  109 106 108 104  CO2 22  --  25 29 23  21*  GLUCOSE 106*  --  106* 87 95 98  BUN 21*  --  15 21* 22* 25*  CREATININE 1.22  --  1.20 1.34* 1.23 1.49*  CALCIUM 8.5*  --  8.8* 8.6* 8.7* 8.6*  MG  --  2.0  --   --   --   --     Liver Function Tests: Recent Labs  Lab 09/21/21 1314 09/22/21 0545 09/23/21 0545 09/24/21 0505  AST 29 25 21   --   ALT 40 35 27  --   ALKPHOS 88 87 73  --   BILITOT 0.8 1.2 0.7  --   PROT 6.6 6.9 6.2* 6.6  ALBUMIN 3.7 3.8 3.5  --    No results for input(s): LIPASE, AMYLASE in the last 168 hours. No results for input(s): AMMONIA in the last 168 hours.  CBC: Recent Labs  Lab 09/21/21 1314 09/22/21 0545  WBC 10.2 9.4  HGB 14.4 14.6  HCT 43.4 44.0  MCV 90.6 90.9  PLT 315 312    Cardiac Enzymes: No results for input(s): CKTOTAL, CKMB, CKMBINDEX, TROPONINI in the last 168 hours.  BNP: BNP (last 3 results) Recent Labs    09/21/21 1314   BNP 1,380.0*    ProBNP (last 3 results) No results for input(s): PROBNP in the last 8760 hours.   CBG: No results for  input(s): GLUCAP in the last 168 hours.  Coagulation Studies: No results for input(s): LABPROT, INR in the last 72 hours.   Imaging   [place] CHEST (PLEURAL EFFUSION)  Result Date: 09/24/2021 CLINICAL DATA:  RIGHT pleural effusion EXAM: CHEST ULTRASOUND COMPARISON:  CT angio chest 09/21/2021 FINDINGS: Small to moderate RIGHT pleural effusion identified. IMPRESSION: Small to moderate RIGHT pleural effusion. Patient being transferred to [facility] for cardiac catheterization; thoracentesis not performed. Electronically Signed   By: Ulyses Southward M.D.   On: 09/24/2021 10:32     Medications:     Current Medications:  carvedilol  6.25 mg Oral BID WC   enoxaparin (LOVENOX) injection  40 mg Subcutaneous Q24H   furosemide  40 mg Oral Daily   losartan  25 mg Oral Daily   polyethylene glycol  17 g Oral Daily   sodium chloride flush  3 mL Intravenous Q12H    Infusions:  sodium chloride     sodium chloride 10 mL/hr at 09/25/21 0418     Assessment/Plan   1. Acute systolic HF - echo 25-35% - he is volume overloaded with probable low output - suspect NICM - plan R/L cath today - diurese - titrate GDMT - if cath negative will need cMRI  2. HTN - titrate GDMT  3. OSA - continue CPAP    Length of Stay: 3  Arvilla Meres, MD  09/25/2021, 8:48 AM  Advanced Heart Failure Team Pager [phone] (M-F; 7a - 5p)  Please contact [facility] for night-coverage after hours (4p -7a ) and weekends on [email or web address]

## 2021-09-25 NOTE — Progress Notes (Signed)
Heart Failure Navigator Progress Note  Assessed for Heart & Vascular TOC clinic readiness.  Patient does not meet criteria due to AHF rounding team consulted this hospitalization.   Navigator available for reassessment of patient.   Kritika Stukes, MSN, RN Heart Failure Nurse Navigator [phone]   

## 2021-09-25 NOTE — Progress Notes (Signed)
Patient declined CPAP. No unit in room.

## 2021-09-25 NOTE — Progress Notes (Signed)
Mobility Specialist Progress Note:   09/25/21 1148  Mobility  Activity Ambulated to bathroom  Level of Assistance Modified independent, requires aide device or extra time  Assistive Device None  Distance Ambulated (ft) 32 ft  Mobility Out of bed for toileting  Mobility Response Tolerated well  Mobility performed by Mobility specialist  $Mobility charge 1 Mobility   No complaints of pain. Left in bed with call bell in reach and all needs met.   [facility] Public librarian Phone [phone] Secondary Phone [phone]

## 2021-09-25 NOTE — Interval H&P Note (Signed)
History and Physical Interval Note:  09/25/2021 8:47 AM  Michael Hester  has presented today for surgery, with the diagnosis of chf.  The various methods of treatment have been discussed with the patient and family. After consideration of risks, benefits and other options for treatment, the patient has consented to  Procedure(s): RIGHT/LEFT HEART CATH AND CORONARY ANGIOGRAPHY (N/A) and possible coronary angioplasty as a surgical intervention.  The patient's history has been reviewed, patient examined, no change in status, stable for surgery.  I have reviewed the patient's chart and labs.  Questions were answered to the patient's satisfaction.     [name]

## 2021-09-25 NOTE — TOC Benefit Eligibility Note (Signed)
Patient Product/process development scientist completed.    The patient is currently admitted and upon discharge could be taking Farxiga 10 mg.  The current 30 day co-pay is, $25.00.   The patient is currently admitted and upon discharge could be taking Jardiance 10 mg.  Prior Authorization Required  The patient is currently admitted and upon discharge could be taking Entresto 24-26 mg.  The current 30 day co-pay is, $642.11 due to a $.1,916.74 non Formulary deductible remaining   The patient is insured through [employer]    Roland Earl, CPhT Pharmacy Patient Advocate Specialist [facility] Patient Advocate Team Direct Number: [phone]  Fax: [phone]

## 2021-09-26 LAB — BASIC METABOLIC PANEL
Anion gap: 12 (ref 5–15)
BUN: 21 mg/dL — ABNORMAL HIGH (ref 6–20)
CO2: 25 mmol/L (ref 22–32)
Calcium: 8.6 mg/dL — ABNORMAL LOW (ref 8.9–10.3)
Chloride: 100 mmol/L (ref 98–111)
Creatinine, Ser: 1.29 mg/dL — ABNORMAL HIGH (ref 0.61–1.24)
GFR, Estimated: >60 mL/min (ref >60)
Glucose, Bld: 74 mg/dL (ref 70–99)
Potassium: 3.5 mmol/L (ref 3.5–5.1)
Sodium: 137 mmol/L (ref 135–145)

## 2021-09-26 LAB — MAGNESIUM: Magnesium: 2 mg/dL (ref 1.7–2.4)

## 2021-09-26 MED ORDER — TORSEMIDE 20 MG PO TABS
40.0000 mg | ORAL_TABLET | Freq: Every day | ORAL | Status: DC
  Administered 2021-09-27: 40 mg via ORAL
  Filled 2021-09-26: qty 2

## 2021-09-26 MED ORDER — POTASSIUM CHLORIDE CRYS ER 20 MEQ PO TBCR
40.0000 meq | EXTENDED_RELEASE_TABLET | Freq: Once | ORAL | Status: AC
  Administered 2021-09-26: 09:00:00 40 meq via ORAL
  Filled 2021-09-26: qty 2

## 2021-09-26 MED ORDER — ATORVASTATIN CALCIUM 40 MG PO TABS
40.0000 mg | ORAL_TABLET | Freq: Every day | ORAL | Status: DC
  Administered 2021-09-26 – 2021-09-27 (×2): 40 mg via ORAL
  Filled 2021-09-26 (×2): qty 1

## 2021-09-26 MED ORDER — ASPIRIN EC 81 MG PO TBEC
81.0000 mg | DELAYED_RELEASE_TABLET | Freq: Every day | ORAL | Status: DC
  Administered 2021-09-26 – 2021-09-27 (×2): 81 mg via ORAL
  Filled 2021-09-26 (×2): qty 1

## 2021-09-26 MED ORDER — DAPAGLIFLOZIN PROPANEDIOL 10 MG PO TABS
10.0000 mg | ORAL_TABLET | Freq: Every day | ORAL | Status: DC
  Administered 2021-09-26 – 2021-09-27 (×2): 10 mg via ORAL
  Filled 2021-09-26 (×2): qty 1

## 2021-09-26 MED ORDER — ATORVASTATIN CALCIUM 80 MG PO TABS: 80.0000 mg | ORAL_TABLET | Freq: Every day | ORAL | Status: DC

## 2021-09-26 MED FILL — Furosemide Inj 10 MG/ML: INTRAMUSCULAR | Qty: 4 | Status: AC

## 2021-09-26 MED FILL — Heparin Sod (Porcine)-NaCl IV Soln 1000 Unit/500ML-0.9%: INTRAVENOUS | Qty: 1000 | Status: AC

## 2021-09-26 MED FILL — Fentanyl Citrate Preservative Free (PF) Inj 100 MCG/2ML: INTRAMUSCULAR | Qty: 0.5 | Status: AC

## 2021-09-26 MED FILL — Midazolam HCl Inj PF 2 MG/2ML (Base Equivalent): INTRAMUSCULAR | Qty: 1 | Status: AC

## 2021-09-26 NOTE — Plan of Care (Signed)

## 2021-09-26 NOTE — Progress Notes (Addendum)
Advanced Heart Failure Rounding Note  PCP-Cardiologist: None   Subjective:     Feeling better. Less dyspnea.   Diuresed well. Weight down 7 lb overnight. Negative 4L yesterday.     Objective:   Weight Range: 106.5 kg Body mass index is 39.07 kg/m.   Vital Signs:   Temp:  [97.6 F (36.4 C)-98.5 F (36.9 C)] 98 F (36.7 C) (12/14 0902) Pulse Rate:  [86-101] 98 (12/14 0902) Resp:  [18-20] 20 (12/14 0902) BP: (108-127)/(77-97) 112/83 (12/14 0902) SpO2:  [95 %-98 %] 95 % (12/14 0902) Weight:  [106.5 kg] 106.5 kg (12/14 0019) Last BM Date: 09/25/21  Weight change: Filed Weights   09/24/21 2100 09/25/21 0007 09/26/21 0019  Weight: 110.4 kg 109.5 kg 106.5 kg    Intake/Output:   Intake/Output Summary (Last 24 hours) at 09/26/2021 1043 Last data filed at 09/26/2021 0904 Gross per 24 hour  Intake 1139 ml  Output 4470 ml  Net -3331 ml      Physical Exam    General:  No distress. Sitting up in chair. HEENT: Normal Neck: Supple. No JVD. Carotids 2+ bilat; no bruits. No lymphadenopathy or thyromegaly appreciated. Cor: PMI nondisplaced. Regular rate & rhythm. No rubs, gallops or murmurs. Lungs: Clear Abdomen: Soft, nontender, nondistended. No hepatosplenomegaly. No bruits or masses. Good bowel sounds. Extremities: No cyanosis, clubbing, rash, trace edema Neuro: Alert & orientedx3, cranial nerves grossly intact. moves all 4 extremities w/o difficulty. Affect pleasant   Telemetry   NSR 90s no PVCs Personally reviewed   Labs    CBC Recent Labs    09/25/21 0906 09/25/21 0914  HGB 11.2*   13.6 12.2*  HCT 33.0*   40.0 36.0*   Basic Metabolic Panel Recent Labs    68/08/81 0040 09/25/21 0903 09/25/21 0914 09/26/21 0459  NA 135   < > 144 137  K 3.6   < > 3.1* 3.5  CL 104  --   --  100  CO2 21*  --   --  25  GLUCOSE 98  --   --  74  BUN 25*  --   --  21*  CREATININE 1.49*  --   --  1.29*  CALCIUM 8.6*  --   --  8.6*  MG  --   --   --  2.0   < > =  values in this interval not displayed.   Liver Function Tests Recent Labs    09/24/21 0505  PROT 6.6   No results for input(s): LIPASE, AMYLASE in the last 72 hours. Cardiac Enzymes No results for input(s): CKTOTAL, CKMB, CKMBINDEX, TROPONINI in the last 72 hours.  BNP: BNP (last 3 results) Recent Labs    09/21/21 1314  BNP 1,380.0*    ProBNP (last 3 results) No results for input(s): PROBNP in the last 8760 hours.   D-Dimer No results for input(s): DDIMER in the last 72 hours. Hemoglobin A1C No results for input(s): HGBA1C in the last 72 hours. Fasting Lipid Panel Recent Labs    09/24/21 0505  CHOL 117  HDL 34*  LDLCALC 67  TRIG 82  CHOLHDL 3.4   Thyroid Function Tests Recent Labs    09/24/21 0505  TSH 2.610    Other results:   Imaging    No results found.   Medications:     Scheduled Medications:  carvedilol  6.25 mg Oral BID WC   digoxin  0.125 mg Oral Daily   enoxaparin (LOVENOX) injection  40 mg  Subcutaneous Q24H   polyethylene glycol  17 g Oral Daily   sacubitril-valsartan  1 tablet Oral BID   sodium chloride flush  3 mL Intravenous Q12H   spironolactone  12.5 mg Oral Daily   [START ON 09/27/2021] torsemide  40 mg Oral Daily    Infusions:  sodium chloride      PRN Medications: sodium chloride, acetaminophen, alum & mag hydroxide-simeth, hydrALAZINE, ondansetron (ZOFRAN) IV, sodium chloride flush   Assessment/Plan  1. Acute systolic HF/ new NICM - viral illness end of November 2022. Worsening dyspnea 1 week PTA - echo EF 25-35%, RV moderately reduced - No arrhythmias to explain CM. BP not optimally controlled on admit.  - R/LHC with minimal nonobstructive CAD, RA mean 17 mmHg, PA mean 46 mmHg, PCWP 28 mmHg, Fick CO 4.6/CI 2.2, PVR 3.9 - cMRI ordered - Diuresed well with IV lasix. Received one dose IV lasix this am. Will stop.  - Start 40 mg po Torsemide tomorrow. - digoxin 0.125 mg daily - coreg 6.25 mg BID - entresto 24/26 mg  BID - spiro 12.5 mg daily - start farxiga 10 mg daily (a1c 5.5%) - Repeat sleep study as outpatient  2. HTN - titrate GDMT as above - BP improving  3. 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 OSA - Has not been using CPAP regularly - Will need repeat sleep study   Length of Stay: 4  FINCH, LINDSAY N, PA-C  09/26/2021, 10:43 AM  Advanced Heart Failure Team Pager [phone] (M-F; 7a - 5p)  Please contact [facility] for night-coverage after hours (5p -7a ) and weekends on [email or web address]   Patient seen and examined with the above-signed Advanced Practice Provider and/or Housestaff. I personally reviewed laboratory data, imaging studies and relevant notes. I independently examined the patient and formulated the important aspects of the plan. I have edited the note to reflect any of my changes or salient points. I have personally discussed the plan with the patient and/or family.  5L diuresis over night. Feels better. No CP, orthopnea or PND.   General:  Well appearing. No resp difficulty HEENT: normal Neck: supple. no JVD. Carotids 2+ bilat; no bruits. No lymphadenopathy or thryomegaly appreciated. Cor: PMI nondisplaced. Regular rate & rhythm. No rubs, gallops or murmurs. Lungs: clear Abdomen: soft, nontender, nondistended. No hepatosplenomegaly. No bruits or masses. Good bowel sounds. Extremities: no cyanosis, clubbing, rash, edema Neuro: alert & orientedx3, cranial nerves grossly intact. moves all 4 extremities w/o difficulty. Affect pleasant   Cath results reviewed. He has severe NICM. Volume status improved. Will plan cMRI to further evaluate etiology of cardiomyopathy. Change to po diuretic. Titrate GDMT.   Arvilla Meres, MD  12:45 PM

## 2021-09-27 ENCOUNTER — Inpatient Hospital Stay (HOSPITAL_COMMUNITY): Payer: Managed Care, Other (non HMO)

## 2021-09-27 ENCOUNTER — Other Ambulatory Visit (HOSPITAL_COMMUNITY): Payer: Self-pay

## 2021-09-27 DIAGNOSIS — I5021 Acute systolic (congestive) heart failure: Secondary | ICD-10-CM

## 2021-09-27 LAB — BASIC METABOLIC PANEL
Anion gap: 9 (ref 5–15)
BUN: 24 mg/dL — ABNORMAL HIGH (ref 6–20)
CO2: 25 mmol/L (ref 22–32)
Calcium: 8.9 mg/dL (ref 8.9–10.3)
Chloride: 105 mmol/L (ref 98–111)
Creatinine, Ser: 1.48 mg/dL — ABNORMAL HIGH (ref 0.61–1.24)
GFR, Estimated: 56 mL/min — ABNORMAL LOW (ref >60)
Glucose, Bld: 72 mg/dL (ref 70–99)
Potassium: 4.7 mmol/L (ref 3.5–5.1)
Sodium: 139 mmol/L (ref 135–145)

## 2021-09-27 MED ORDER — SPIRONOLACTONE 25 MG PO TABS
12.5000 mg | ORAL_TABLET | Freq: Every day | ORAL | 5 refills | Status: DC
  Filled 2021-09-27: qty 15, 30d supply, fill #0

## 2021-09-27 MED ORDER — DAPAGLIFLOZIN PROPANEDIOL 10 MG PO TABS
10.0000 mg | ORAL_TABLET | Freq: Every day | ORAL | 5 refills | Status: DC
  Filled 2021-09-27: qty 30, 30d supply, fill #0

## 2021-09-27 MED ORDER — FUROSEMIDE 40 MG PO TABS
40.0000 mg | ORAL_TABLET | Freq: Every day | ORAL | 11 refills | Status: DC | PRN
  Filled 2021-09-27: qty 30, 30d supply, fill #0

## 2021-09-27 MED ORDER — ASPIRIN 81 MG PO TBEC
81.0000 mg | DELAYED_RELEASE_TABLET | Freq: Every day | ORAL | 11 refills | Status: DC
  Filled 2021-09-27: qty 30, 30d supply, fill #0

## 2021-09-27 MED ORDER — DIGOXIN 125 MCG PO TABS
0.1250 mg | ORAL_TABLET | Freq: Every day | ORAL | 5 refills | Status: DC
  Filled 2021-09-27: qty 30, 30d supply, fill #0

## 2021-09-27 MED ORDER — ATORVASTATIN CALCIUM 40 MG PO TABS
40.0000 mg | ORAL_TABLET | Freq: Every day | ORAL | 5 refills | Status: DC
  Filled 2021-09-27: qty 30, 30d supply, fill #0

## 2021-09-27 MED ORDER — SACUBITRIL-VALSARTAN 24-26 MG PO TABS
1.0000 | ORAL_TABLET | Freq: Two times a day (BID) | ORAL | 5 refills | Status: DC
  Filled 2021-09-27: qty 60, 30d supply, fill #0

## 2021-09-27 MED ORDER — GADOBUTROL 1 MMOL/ML IV SOLN
10.0000 mL | Freq: Once | INTRAVENOUS | Status: AC | PRN
  Administered 2021-09-27: 10 mL via INTRAVENOUS

## 2021-09-27 MED ORDER — CARVEDILOL 6.25 MG PO TABS
6.2500 mg | ORAL_TABLET | Freq: Two times a day (BID) | ORAL | 5 refills | Status: DC
  Filled 2021-09-27: qty 60, 30d supply, fill #0

## 2021-09-27 NOTE — Progress Notes (Signed)
Pt refused cpap for tonight 

## 2021-09-27 NOTE — Progress Notes (Addendum)
Advanced Heart Failure Rounding Note  PCP-Cardiologist: None   Subjective:    Feels well today. No dyspnea. No chest pain. LEE resolved. Wt continues to trend down, overall down 8 lb overall.   Tolerating GDMT ok. No dizziness.    SCr 1.49>>1.29>>1.48  K 4.7  Completed MRI. Results pending.   Objective:   Weight Range: 105.9 kg Body mass index is 38.86 kg/m.   Vital Signs:   Temp:  [97.5 F (36.4 C)-97.9 F (36.6 C)] 97.5 F (36.4 C) (12/15 1119) Pulse Rate:  [89-99] 93 (12/15 1119) Resp:  [15-17] 17 (12/15 1119) BP: (104-131)/(87-102) 131/102 (12/15 1119) SpO2:  [97 %-99 %] 97 % (12/15 1119) Weight:  [105.9 kg] 105.9 kg (12/15 0447) Last BM Date: 09/25/21  Weight change: Filed Weights   09/25/21 0007 09/26/21 0019 09/27/21 0447  Weight: 109.5 kg 106.5 kg 105.9 kg    Intake/Output:   Intake/Output Summary (Last 24 hours) at 09/27/2021 1252 Last data filed at 09/27/2021 0942 Gross per 24 hour  Intake 1090 ml  Output 2125 ml  Net -1035 ml      Physical Exam    General:  No distress. Sitting up in chair. HEENT: Normal Neck: Supple. No JVD. Carotids 2+ bilat; no bruits. No lymphadenopathy or thyromegaly appreciated. Cor: PMI nondisplaced. Regular rate & rhythm. No rubs, gallops or murmurs. Lungs: Clear Abdomen: Soft, nontender, nondistended. No hepatosplenomegaly. No bruits or masses. Good bowel sounds. Extremities: No cyanosis, clubbing, rash, trace edema Neuro: Alert & orientedx3, cranial nerves grossly intact. moves all 4 extremities w/o difficulty. Affect pleasant   Telemetry   NSR 90s Personally reviewed   Labs    CBC Recent Labs    09/25/21 0906 09/25/21 0914  HGB 11.2*   13.6 12.2*  HCT 33.0*   40.0 36.0*   Basic Metabolic Panel Recent Labs    41/28/78 0459 09/27/21 0243  NA 137 139  K 3.5 4.7  CL 100 105  CO2 25 25  GLUCOSE 74 72  BUN 21* 24*  CREATININE 1.29* 1.48*  CALCIUM 8.6* 8.9  MG 2.0  --    Liver Function  Tests No results for input(s): AST, ALT, ALKPHOS, BILITOT, PROT, ALBUMIN in the last 72 hours.  No results for input(s): LIPASE, AMYLASE in the last 72 hours. Cardiac Enzymes No results for input(s): CKTOTAL, CKMB, CKMBINDEX, TROPONINI in the last 72 hours.  BNP: BNP (last 3 results) Recent Labs    09/21/21 1314  BNP 1,380.0*    ProBNP (last 3 results) No results for input(s): PROBNP in the last 8760 hours.   D-Dimer No results for input(s): DDIMER in the last 72 hours. Hemoglobin A1C No results for input(s): HGBA1C in the last 72 hours. Fasting Lipid Panel No results for input(s): CHOL, HDL, LDLCALC, TRIG, CHOLHDL, LDLDIRECT in the last 72 hours.  Thyroid Function Tests No results for input(s): TSH, T4TOTAL, T3FREE, THYROIDAB in the last 72 hours.  Invalid input(s): FREET3   Other results:   Imaging    No results found.   Medications:     Scheduled Medications:  aspirin EC  81 mg Oral Daily   atorvastatin  40 mg Oral Daily   carvedilol  6.25 mg Oral BID WC   dapagliflozin propanediol  10 mg Oral Daily   digoxin  0.125 mg Oral Daily   enoxaparin (LOVENOX) injection  40 mg Subcutaneous Q24H   polyethylene glycol  17 g Oral Daily   sacubitril-valsartan  1 tablet Oral BID  sodium chloride flush  3 mL Intravenous Q12H   spironolactone  12.5 mg Oral Daily   torsemide  40 mg Oral Daily    Infusions:  sodium chloride      PRN Medications: sodium chloride, acetaminophen, alum & mag hydroxide-simeth, hydrALAZINE, ondansetron (ZOFRAN) IV, sodium chloride flush   Assessment/Plan  1. Acute systolic HF/ new NICM - viral illness end of November 2022. Worsening dyspnea 1 week PTA - echo EF 25-35%, RV moderately reduced - No arrhythmias to explain CM. BP not optimally controlled on admit.  - R/LHC with minimal nonobstructive CAD, RA mean 17 mmHg, PA mean 46 mmHg, PCWP 28 mmHg, Fick CO 4.6/CI 2.2, PVR 3.9 - cMRI done today, results pending  - Diuresed well  with IV lasix. Euvolemic on exam  - Start 40 mg po Torsemide today  - digoxin 0.125 mg daily - coreg 6.25 mg BID - entresto 24/26 mg BID - Spiro 12.5 mg daily - farxiga 10 mg daily (a1c 5.5%) - Repeat sleep study as outpatient  2. HTN - titrate GDMT as above - BP improving  3. 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 OSA - Has not been using CPAP regularly - Will need repeat sleep study  Possible D/c home today after cMRI.    Length of Stay: [address], PA-C  09/27/2021, 12:52 PM  Advanced Heart Failure Team Pager [phone] (M-F; 7a - 5p)  Please contact [facility] for night-coverage after hours (5p -7a ) and weekends on [email or web address]    Feels well. Denies CP or SOB. SCR up. cMRi pending  General:  Well appearing. No resp difficulty HEENT: normal Neck: supple. no JVD. Carotids 2+ bilat; no bruits. No lymphadenopathy or thryomegaly appreciated. Cor: PMI nondisplaced. Regular rate & rhythm. No rubs, gallops or murmurs. Lungs: clear Abdomen: soft, nontender, nondistended. No hepatosplenomegaly. No bruits or masses. Good bowel sounds. Extremities: no cyanosis, clubbing, rash, edema Neuro: alert & orientedx3, cranial nerves grossly intact. moves all 4 extremities w/o difficulty. Affect pleasant  Doing well. Await results of cMRI. Agree with d/c today. Would use lasix 40 daily PRN only.   Arvilla Meres, MD  2:44 PM

## 2021-09-27 NOTE — Discharge Summary (Addendum)
Advanced Heart Failure Team  Discharge Summary   Patient ID: Michael Hester MRN: [id number], DOB/AGE: 1966-05-13 55 y.o. Admit date: 09/21/2021 D/C date:     09/27/2021   Primary Discharge Diagnoses:  Acute Biventricular Heart Failure/ NICM  Hypertension  CAD (nonobstructive) OSA   Hospital Course:  Michael Hester is a 55 yo male with history of HTN, OSA admitted to [facility] over the last few days with SOB and new CHF. Echo 09/22/21 LVEF 25-30% with global HK grade 2 DD. Patient says he had a "virus" over Thanksgiving with a lot of malaise, nausea, URI, covid negative. Last week when he switched from night shift to days he had trouble sleeping due to orthopnea and PND. He did a telehealth visit and was told to come to ED. No significant chest pain, has been inactive and gained 30 lbs over the past year. Has HTN - not optimally treated, family hx early CAD - father MI 96's, mother CHF, ? HLD, nonsmoker, no DM.   [place] 31, 33. ECG: Sinus tach 125 No ST-T wave abnormalities. Narrow QRS   Found to be in acute CHF w/ fluid overload. Echo showed biventricular failure. LVEF 25-35%. RV moderately reduced, GIIDD, trivial MR.   R/LHC showed minimal nonobstructive CAD, RA mean 17 mmHg, PA mean 46 mmHg, PCWP 28 mmHg, Fick CO 4.6/CI 2.2, PVR 3.9.   cMRI showed moderate LV dilation with diffuse hypokinesis, EF 21%. No LV thrombus. Mild RV diliation with EF 23%. Nonspecific LGE at the RV insertion site, this is nonspecific and can be seen with pressure/volume overload. T1 parameters within normal limits.  He was diuresed w/ IV Lasix and GDMT initiated. Repeat outpatient sleep study recommended.   Last seen and examined by Dr. Gala Romney 09/27/21 and felt stable for discharge home. Post hospital f/u arranged in the [facility].   Cardiac Studies   2D Echo 09/22/21 left ventricular ejection fraction, by estimation, is 25 to 30%. The left ventricle has severely decreased function. The left ventricle demonstrates  global hypokinesis. The left ventricular internal cavity size was moderately dilated. Left ventricular diastolic parameters are consistent with Grade II diastolic dysfunction (pseudonormalization). Elevated left ventricular end-diastolic pressure. 1. Right ventricular systolic function is moderately reduced. The right ventricular size is mildly enlarged. Mildly increased right ventricular wall thickness. There is moderately elevated pulmonary artery systolic pressure. 2. 3. Left atrial size was mildly dilated. The mitral valve is normal in structure. Trivial mitral valve regurgitation. No evidence of mitral stenosis. 4. The aortic valve is tricuspid. Aortic valve regurgitation is not visualized. No aortic stenosis is present. 5. The inferior vena cava is normal in size with greater than 50% respiratory variability, suggesting right atrial pressure of 3 mmHg.   R/LHC 09/25/21   Ost LM to Mid LM lesion is 20% stenosed.   Prox LAD to Mid LAD lesion is 40% stenosed.   The left ventricular ejection fraction is less than 25% by visual estimate.   Findings:   Ao = 132/96 (112) LV = 133/34 RA = 17 RV = 57/24 PA = 62/34 (46) PCW = 28 Fick cardiac output/index = 4.6/2.2 PVR = 3.9 FA sat = 99% PA sat = 68%, 71%   Assessment: 1. Minimal non-obstructive CAD 2. Severe NICM EF < 20% with markedly elevated filling pressures  Cardiac MRI 09/27/21 IMPRESSION: 1. Moderate LV dilation with diffuse hypokinesis, EF 21%. No LV thrombus.   2.  Mild RV diliation with EF 23%.   3. Nonspecific LGE at the RV  insertion site, this is nonspecific and can be seen with pressure/volume overload.   4.  T1 parameters within normal limits.  Discharge Weight Range: 233 lb  Discharge Vitals: Blood pressure (!) 131/102, pulse 93, temperature (!) 97.5 F (36.4 C), temperature source Oral, resp. rate 17, height 5\' 5"  (1.651 m), weight 105.9 kg, SpO2 97 %.  Labs: Lab Results  Component Value Date    WBC 9.4 09/22/2021   HGB 12.2 (L) 09/25/2021   HCT 36.0 (L) 09/25/2021   MCV 90.9 09/22/2021   PLT 312 09/22/2021    Recent Labs  Lab 09/23/21 0545 09/24/21 0505 09/25/21 0040 09/27/21 0243  NA  --  138   < > 139  K  --  3.9   < > 4.7  CL  --  108   < > 105  CO2  --  23   < > 25  BUN  --  22*   < > 24*  CREATININE  --  1.23   < > 1.48*  CALCIUM  --  8.7*   < > 8.9  PROT 6.2* 6.6  --   --   BILITOT 0.7  --   --   --   ALKPHOS 73  --   --   --   ALT 27  --   --   --   AST 21  --   --   --   GLUCOSE  --  95   < > 72   < > = values in this interval not displayed.   Lab Results  Component Value Date   CHOL 117 09/24/2021   HDL 34 (L) 09/24/2021   LDLCALC 67 09/24/2021   TRIG 82 09/24/2021   BNP (last 3 results) Recent Labs    09/21/21 1314  BNP 1,380.0*    ProBNP (last 3 results) No results for input(s): PROBNP in the last 8760 hours.   Diagnostic Studies/Procedures   MR CARDIAC MORPHOLOGY W WO CONTRAST  Result Date: 09/27/2021 CLINICAL DATA:  Nonischemic cardiomyopathy EXAM: CARDIAC MRI TECHNIQUE: The patient was scanned on a 1.5 Tesla GE magnet. A dedicated cardiac coil was used. Functional imaging was done using Fiesta sequences. 2,3, and 4 chamber views were done to assess for RWMA's. Modified Simpson's rule using a short axis stack was used to calculate an ejection fraction on a dedicated work 09/29/2021. The patient received 8 cc of Gadavist. After 10 minutes inversion recovery sequences were used to assess for infiltration and scar tissue. FINDINGS: Small right pleural effusion. Trivial pericardial effusion. Moderate left ventricular dilation with normal wall thickness. Severe global hypokinesis, EF 21%. No LV thrombus. Mild right ventricular enlargement with severe systolic dysfunction, EF 23%. Mild left atrial enlargement. Normal right atrium. Minimal mitral regurgitation noted. Trileaflet aortic valve with no stenosis or regurgitation. Delayed  enhancement imaging: Small area of mid-wall late gadolinium enhancement (LGE) in the basal inferior wall at the RV insertion site. MEASUREMENTS: MEASUREMENTS LVEDV 285 mL LVSV 59 mL LVEF 21% RVEDV 206 mL RVSV 47% RVEF 23% T1 1042, ECV 27% IMPRESSION: 1. Moderate LV dilation with diffuse hypokinesis, EF 21%. No LV thrombus. 2.  Mild RV diliation with EF 23%. 3. Nonspecific LGE at the RV insertion site, this is nonspecific and can be seen with pressure/volume overload. 4.  T1 parameters within normal limits. Dalton Mclean Electronically Signed   By: Research officer, trade union M.D.   On: 09/27/2021 16:55    Discharge Medications   Allergies as  of 09/27/2021   No Known Allergies      Medication List     TAKE these medications    Aspirin Low Dose 81 MG EC tablet Generic drug: aspirin Take 1 tablet (81 mg total) by mouth daily. Swallow whole. Start taking on: September 28, 2021   atorvastatin 40 MG tablet Commonly known as: LIPITOR Take 1 tablet (40 mg total) by mouth daily. Start taking on: September 28, 2021   carvedilol 6.25 MG tablet Commonly known as: COREG Take 1 tablet (6.25 mg total) by mouth 2 (two) times daily with a meal.   digoxin 0.125 MG tablet Commonly known as: LANOXIN Take 1 tablet (0.125 mg total) by mouth daily. Start taking on: September 28, 2021   Entresto 24-26 MG Generic drug: sacubitril-valsartan Take 1 tablet by mouth 2 (two) times daily.   Farxiga 10 MG Tabs tablet Generic drug: dapagliflozin propanediol Take 1 tablet (10 mg total) by mouth daily. Start taking on: September 28, 2021   furosemide 40 MG tablet Commonly known as: Lasix Take 1 tablet (40 mg total) by mouth daily as needed. For 3 lb weight gain in 24 hr or 5 lb weigh gain in 1 week   spironolactone 25 MG tablet Commonly known as: ALDACTONE Take 1/2 tablet (12.5 mg total) by mouth daily. Start taking on: September 28, 2021        Disposition   The patient will be discharged in stable condition to  home.   Follow-up Information     [facility] Follow up.   Specialty: Cardiology Why: 10/11/21 1:30 PM   The [facility] at [facility], Doran Stabler information: [address] [id number] [place]he[place] Bon[place] [employer] [place] [address] [phone]                  Duration of Discharge Encounter: Greater than 35 minutes   Signed, Knute Neu  09/27/2021, 5:05 PM  Agree with above. Home today. See progress note from today for full details.   Will see back in Clinic next week. F/u cMRI. Meds reviewed.   Arvilla Meres, MD  10:43 PM

## 2021-09-27 NOTE — Plan of Care (Signed)

## 2021-10-05 NOTE — Progress Notes (Signed)
ADVANCED HF CLINIC CONSULT NOTE   Primary Care: Sharilyn Sites, MD HF Cardiologist: Dr. Haroldine Laws  HPI: Michael Hester is a 55 y.o.male with history of HTN, OSA and new diagnosis of systolic HF  Admitted [id number] to [facility] with new CHF. Echo showed biventricular failure. LVEF 25-35%. RV moderately reduced, GIIDD, trivial MR. He was transferred to [facility] and AHF consulted. He underwent R/LHC showed minimal nonobstructive CAD, RA mean 17 mmHg, PA mean 46 mmHg, PCWP 28 mmHg, Fick CO 4.6/CI 2.2, PVR 3.9. cMRI showed moderate LV dilation with diffuse hypokinesis, EF 21%. No LV thrombus. Mild RV diliation with EF 23%. Nonspecific LGE at the RV insertion site, this is nonspecific and can be seen with pressure/volume overload. T1 parameters within normal limits. He was diuresed w/ IV Lasix and GDMT initiated. Discharged home, weight 233 lbs.  Today he presents for post hospital HF follow up with his wife and daughter. Overall feeling fine. No SOB with walking to mailbox and back. Denies palpitations, CP, dizziness, edema, or PND/Orthopnea. Appetite ok. No fever or chills. Weight at home 232 pounds. Taking all medications. Works in a Education officer, [employer] full time, does not do heavy lifting. Asking about exercise and return to work.  Cardiac Studies: - [facility] 09/25/21   Ost LM to Mid LM lesion is 20% stenosed.   Prox LAD to Mid LAD lesion is 40% stenosed.   The left ventricular ejection fraction is less than 25% by visual estimate.   Findings: Ao = 132/96 (112) LV = 133/34 RA = 17 RV = 57/24 PA = 62/34 (46) PCW = 28 Fick cardiac output/index = 4.6/2.2 PVR = 3.9 FA sat = 99% PA sat = 68%, 71%   Assessment: 1. Minimal non-obstructive CAD 2. Severe NICM EF < 20% with markedly elevated filling pressures   - Cardiac MRI 09/27/21 IMPRESSION: 1. Moderate LV dilation with diffuse hypokinesis, EF 21%. No LV thrombus. 2.  Mild RV diliation with EF 23%. 3. Nonspecific LGE at the RV insertion site, this is  nonspecific and can be seen with pressure/volume overload. 4.  T1 parameters within normal limits.  Review of Systems: [y] = yes, [ ]  = no   General: Weight gain [ ] ; Weight loss [ ] ; Anorexia [ ] ; Fatigue [ ] ; Fever [ ] ; Chills [ ] ; Weakness [ ]   Cardiac: Chest pain/pressure [ ] ; Resting SOB [ ] ; Exertional SOB [ ] ; Orthopnea [ ] ; Pedal Edema [ ] ; Palpitations [ ] ; Syncope [ ] ; Presyncope [ ] ; Paroxysmal nocturnal dyspnea[ ]   Pulmonary: Cough [ ] ; Wheezing[ ] ; Hemoptysis[ ] ; Sputum [ ] ; Snoring Blue.Reese ]  GI: Vomiting[ ] ; Dysphagia[ ] ; Melena[ ] ; Hematochezia [ ] ; Heartburn[ ] ; Abdominal pain [ ] ; Constipation [ ] ; Diarrhea [ ] ; BRBPR [ ]   GU: Hematuria[ ] ; Dysuria [ ] ; Nocturia[ ]   Vascular: Pain in legs with walking [ ] ; Pain in feet with lying flat [ ] ; Non-healing sores [ ] ; Stroke [ ] ; TIA [ ] ; Slurred speech [ ] ;  Neuro: Headaches[ ] ; Vertigo[ ] ; Seizures[ ] ; Paresthesias[ ] ;Blurred vision [ ] ; Diplopia [ ] ; Vision changes [ ]   Ortho/Skin: Arthritis [ ] ; Joint pain [ ] ; Muscle pain [ ] ; Joint swelling [ ] ; Back Pain [ ] ; Rash [ ]   Psych: Depression[ ] ; Anxiety[ ]   Heme: Bleeding problems [ ] ; Clotting disorders [ ] ; Anemia [ ]   Endocrine: Diabetes [ ] ; Thyroid dysfunction[ ]   Past Medical History:  Diagnosis Date   Hypertension    OSA (obstructive  sleep apnea)    Current Outpatient Medications  Medication Sig Dispense Refill   aspirin 81 MG EC tablet Take 1 tablet (81 mg total) by mouth daily. Swallow whole. 30 tablet 11   atorvastatin (LIPITOR) 40 MG tablet Take 1 tablet (40 mg total) by mouth daily. 30 tablet 5   carvedilol (COREG) 6.25 MG tablet Take 1 tablet (6.25 mg total) by mouth 2 (two) times daily with a meal. 60 tablet 5   dapagliflozin propanediol (FARXIGA) 10 MG TABS tablet Take 1 tablet (10 mg total) by mouth daily. 30 tablet 5   digoxin (LANOXIN) 0.125 MG tablet Take 1 tablet (0.125 mg total) by mouth daily. 30 tablet 5   furosemide (LASIX) 40 MG tablet Take 1 tablet (40  mg total) by mouth daily as needed. For 3 lb weight gain in 24 hr or 5 lb weigh gain in 1 week 30 tablet 11   sacubitril-valsartan (ENTRESTO) 24-26 MG Take 1 tablet by mouth 2 (two) times daily. 60 tablet 5   spironolactone (ALDACTONE) 25 MG tablet Take 1/2 tablet (12.5 mg total) by mouth daily. 30 tablet 5   No current facility-administered medications for this encounter.   No Known Allergies Social History   Socioeconomic History   Marital status: Married    Spouse name: Not on file   Number of children: 4   Years of education: Not on file   Highest education level: Not on file  Occupational History   Occupation: [employer]    Comment: Works in a Lobbyist  Tobacco Use   Smoking status: Never   Smokeless tobacco: Never  Substance and Sexual Activity   Alcohol use: Never   Drug use: Never   Sexual activity: Never  Other Topics Concern   Not on file  Social History Narrative   Lives with wife.     Social Determinants of Health   Financial Resource Strain: Not on file  Food Insecurity: Not on file  Transportation Needs: Not on file  Physical Activity: Not on file  Stress: Not on file  Social Connections: Not on file  Intimate Partner Violence: Not on file   Family History  Problem Relation Age of Onset   Gout Mother    Diabetes Father    Kidney failure Father    BP (!) 140/96    Pulse 76    Wt 107.6 kg (237 lb 3.2 oz)    SpO2 98%    BMI 39.47 kg/m   Wt Readings from Last 3 Encounters:  10/11/21 107.6 kg (237 lb 3.2 oz)  09/27/21 105.9 kg (233 lb 8 oz)  09/09/16 100.7 kg (222 lb)   PHYSICAL EXAM: General:  NAD. No resp difficulty HEENT: Normal Neck: Supple. Thick neck. Carotids 2+ bilat; no bruits. No lymphadenopathy or thryomegaly appreciated. Cor: PMI nondisplaced. Regular rate & rhythm. No rubs, gallops or murmurs. Lungs: Clear Abdomen: Obese, nontender, nondistended. No hepatosplenomegaly. No bruits or masses. Good bowel sounds. Extremities: No  cyanosis, clubbing, rash, edema Neuro: Alert & oriented x 3, cranial nerves grossly intact. Moves all 4 extremities w/o difficulty. Affect pleasant.  ECG: SR 79 bpm (personally reviewed)  ASSESSMENT & PLAN:  1. Chronic systolic HF/ new NICM - viral illness end of November 2022. Worsening dyspnea 1 week PTA - Echo (12/22): EF 25-35%, RV moderately reduced - No arrhythmias to explain CM. BP not optimally controlled on admit.  - R/LHC (12/22): minimal nonobstructive CAD, RA mean 17 mmHg, PA mean 46 mmHg, PCWP 28 mmHg,  Fick CO 4.6/CI 2.2, PVR 3.9 - cMRI (12/22):  Moderate LV dilation with diffuse hypokinesis, EF 21%, RV EF 23%, nonspecific LGE at the RV insertion site.  T1 parameters within normal limits. - NYHA II, volume looks good today. - Increase Entresto to 49/51 mg bid. - Continue Lasix 40 mg daily PRN. - Continue digoxin 0.125 mg daily. - Continue Coreg 6.25 mg bid. - Continue Spiro 12.5 mg daily. - Continue Farxiga 10 mg daily (a1c 5.5%) - Continue to weigh daily, limit fluids to <2L/day and sodium to <2g/day. - BMET and dig level today; BMET in 10 days. - Repeat echo in 3 months.   2. HTN - BP not at goal. - Increase Entresto as above. - Rx given for BP cuff and I asked him to check BP at home and bring log to next visit.   3. CAD - Nonobstructive on cath. - No chest pain - Continue atorvastatin 40 mg daily. - Check Lipids 6-8 weeks.   4. OSA - Has not been using CPAP regularly. - Arrange for home sleep study.  5. Obesity - Body mass index is 39.47 kg/m. - Discussed light physical activity is OK (walking, swimming), keep HR <133, no heavy lifting or strenuous exercise. - Portion control discussed. - Consider pharmacy referral for semaglutide down the road.  We discussed RTW. I advised staying out for work until we titrate medications, at least until follow up in ~ 6 weeks. He has STD and will need paperwork completed; he will provide paperwork to office. He is  agreeable to staying out of work until we re-assess.  Follow up with PharmD in 3 weeks (increase spiro or Entresto), APP in 6 weeks and Dr. Gala Romney in 12 weeks + echo.  Prince Rome, FNP-BC 10/11/21  Greater than 50% of the (total minutes 30) visit spent in counseling/coordination of care regarding (medication mechanism of action and side effects, dietary and lifestyle changes, activity guidelines, and treatment plan)

## 2021-10-11 ENCOUNTER — Other Ambulatory Visit: Payer: Self-pay

## 2021-10-11 ENCOUNTER — Encounter (HOSPITAL_COMMUNITY): Payer: Self-pay

## 2021-10-11 ENCOUNTER — Ambulatory Visit (HOSPITAL_COMMUNITY)
Admit: 2021-10-11 | Discharge: 2021-10-11 | Disposition: A | Discharge status: Home / Self Care | Payer: Managed Care, Other (non HMO) | Attending: Family Medicine | Admitting: Family Medicine

## 2021-10-11 ENCOUNTER — Other Ambulatory Visit (HOSPITAL_COMMUNITY): Payer: Self-pay

## 2021-10-11 VITALS — BP 140/96 | HR 76 | Wt 237.2 lb

## 2021-10-11 DIAGNOSIS — Z7984 Long term (current) use of oral hypoglycemic drugs: Secondary | ICD-10-CM | POA: Diagnosis not present

## 2021-10-11 DIAGNOSIS — G4733 Obstructive sleep apnea (adult) (pediatric): Secondary | ICD-10-CM | POA: Diagnosis not present

## 2021-10-11 DIAGNOSIS — Z79899 Other long term (current) drug therapy: Secondary | ICD-10-CM | POA: Diagnosis not present

## 2021-10-11 DIAGNOSIS — Z91199 Patient's noncompliance with other medical treatment and regimen due to unspecified reason: Secondary | ICD-10-CM | POA: Insufficient documentation

## 2021-10-11 DIAGNOSIS — Z6839 Body mass index (BMI) 39.0-39.9, adult: Secondary | ICD-10-CM | POA: Insufficient documentation

## 2021-10-11 DIAGNOSIS — I2510 Atherosclerotic heart disease of native coronary artery without angina pectoris: Secondary | ICD-10-CM | POA: Diagnosis not present

## 2021-10-11 DIAGNOSIS — I10 Essential (primary) hypertension: Secondary | ICD-10-CM | POA: Diagnosis not present

## 2021-10-11 DIAGNOSIS — Z7182 Exercise counseling: Secondary | ICD-10-CM | POA: Insufficient documentation

## 2021-10-11 DIAGNOSIS — Z713 Dietary counseling and surveillance: Secondary | ICD-10-CM | POA: Diagnosis not present

## 2021-10-11 DIAGNOSIS — I5022 Chronic systolic (congestive) heart failure: Secondary | ICD-10-CM | POA: Diagnosis not present

## 2021-10-11 DIAGNOSIS — E669 Obesity, unspecified: Secondary | ICD-10-CM | POA: Diagnosis not present

## 2021-10-11 DIAGNOSIS — I428 Other cardiomyopathies: Secondary | ICD-10-CM | POA: Diagnosis not present

## 2021-10-11 DIAGNOSIS — I5082 Biventricular heart failure: Secondary | ICD-10-CM | POA: Insufficient documentation

## 2021-10-11 DIAGNOSIS — I110 Hypertensive heart disease with heart failure: Secondary | ICD-10-CM | POA: Diagnosis not present

## 2021-10-11 MED ORDER — ENTRESTO 49-51 MG PO TABS: 1.0000 | ORAL_TABLET | Freq: Two times a day (BID) | ORAL | 4 refills | Status: DC

## 2021-10-11 MED ORDER — ENTRESTO 49-51 MG PO TABS
1.0000 | ORAL_TABLET | Freq: Two times a day (BID) | ORAL | 4 refills | Status: DC
  Filled 2021-10-11: qty 60, 30d supply, fill #0

## 2021-10-11 NOTE — Progress Notes (Signed)
Patient Name: Michael Hester        DOB: 24-Jul-1966      Height:     Weight:237.2lbs   Office Name: [facility]         Referring Provider: Prince Rome  Today's Date: 10/11/2021  Date:   STOP BANG RISK ASSESSMENT S (snore) Have you been told that you snore?     YES   T (tired) Are you often tired, fatigued, or sleepy during the day?   NO  O (obstruction) Do you stop breathing, choke, or gasp during sleep? YES   P (pressure) Do you have or are you being treated for high blood pressure? YES   B (BMI) Is your body index greater than 35 kg/m? YES   A (age) Are you 54 years old or older? YES   N (neck) Do you have a neck circumference greater than 16 inches?   YES   G (gender) Are you a male? YES   TOTAL STOP/BANG YES ANSWERS                                                                        For Office Use Only              Procedure Order Form    YES to 3+ Stop Bang questions OR two clinical symptoms - patient qualifies for WatchPAT (CPT 95800)             Clinical Notes: Will consult Sleep Specialist and refer for management of therapy due to patient increased risk of Sleep Apnea. Ordering a sleep study due to the following two clinical symptoms: Excessive daytime sleepiness G47.10 / Gastroesophageal reflux K21.9 / Nocturia R35.1 / Morning Headaches G44.221 / Difficulty concentrating R41.840 / Memory problems or poor judgment G31.84 / Personality changes or irritability R45.4 / Loud snoring R06.83 / Depression F32.9 / Unrefreshed by sleep G47.8 / Impotence N52.9 / History of high blood pressure R03.0 / Insomnia G47.00    I understand that I am proceeding with a home sleep apnea test as ordered by my treating physician. I understand that untreated sleep apnea is a serious cardiovascular risk factor and it is my responsibility to perform the test and seek management for sleep apnea. I will be contacted with the results and be managed for sleep apnea by a  local sleep physician. I will be receiving equipment and further instructions from [facility]. I shall promptly ship back the equipment via the included mailing label. I understand my insurance will be billed for the test and as the patient I am responsible for any insurance related out-of-pocket costs incurred. I have been provided with written instructions and can call for additional video or telephonic instruction, with 24-hour availability of qualified personnel to answer any questions: Patient Help Desk [phone].  Patient Signature ______________________________________________________   Date______________________ Patient Telemedicine Verbal Consent

## 2021-10-11 NOTE — Patient Instructions (Signed)
Thank you for coming in today  Labs were done today, if any labs are abnormal the clinic will call you  Please check blood pressure at home and log You are given a prescription for blood pressure cuff  INCREASE Entresto to 49/51 mg 1 tablet twice daily  Your physician recommends that you return for lab work in: 10 days  Your physician recommends that you schedule a follow-up appointment in:  3 weeks pharmacy 6 weeks in clinic 12 weeks echocardiogram and Dr. Gala Romney  At the [facility], you and your health needs are our priority. As part of our continuing mission to provide you with exceptional heart care, we have created designated Provider Care Teams. These Care Teams include your primary Cardiologist (physician) and Advanced Practice Providers (APPs- Physician Assistants and Nurse Practitioners) who all work together to provide you with the care you need, when you need it.   You may see any of the following providers on your designated Care Team at your next follow up: Dr Arvilla Meres Dr Carron Curie, NP Robbie Lis, [place] [facility] [place], [place] Karle Plumber, PharmD   Please be sure to bring in all your medications bottles to every appointment.   If you have any questions or concerns before your next appointment please send [place] a message through [place] or call our office at [phone].    TO LEAVE A MESSAGE FOR THE NURSE SELECT OPTION 2, PLEASE LEAVE A MESSAGE INCLUDING: YOUR NAME DATE OF BIRTH CALL BACK NUMBER REASON FOR CALL**this is important as we prioritize the call backs  YOU WILL RECEIVE A CALL BACK THE SAME DAY AS LONG AS YOU CALL BEFORE 4:00 PM

## 2021-10-11 NOTE — Progress Notes (Signed)
Patient given home sleep study device along with instructions to complete.  He is changing insurance beginning Jan 1 therefore we will delay in sending for precert until after that time.  He is aware to await my call to proceed with study.

## 2021-10-12 ENCOUNTER — Other Ambulatory Visit (HOSPITAL_COMMUNITY): Payer: Self-pay | Admitting: Family Medicine

## 2021-10-13 LAB — BASIC METABOLIC PANEL
BUN/Creatinine Ratio: 18 (ref 9–20)
BUN: 20 mg/dL (ref 6–24)
CO2: 24 mmol/L (ref 20–29)
Calcium: 9.6 mg/dL (ref 8.7–10.2)
Chloride: 102 mmol/L (ref 96–106)
Creatinine, Ser: 1.12 mg/dL (ref 0.76–1.27)
Glucose: 117 mg/dL — ABNORMAL HIGH (ref 70–99)
Potassium: 5.1 mmol/L (ref 3.5–5.2)
Sodium: 142 mmol/L (ref 134–144)
eGFR: 78 mL/min/{1.73_m2} (ref >59)

## 2021-10-13 LAB — DIGOXIN LEVEL: Digoxin, Serum: 0.7 ng/mL (ref 0.5–0.9)

## 2021-10-19 NOTE — Progress Notes (Signed)
Advanced Heart Failure Clinic Note   Primary Care: Assunta Found, MD HF Cardiologist: Dr. Gala Romney  HPI:  Mr. Ishikawa is a 56 y.o.male with history of HTN, OSA and new diagnosis of systolic HF.   Admitted 09/2021 to [facility] with new CHF. Echo showed biventricular failure. LVEF 25-35%. RV moderately reduced, GIIDD, trivial MR. He was transferred to [facility] and AHF consulted. He underwent R/LHC showed minimal nonobstructive CAD, RA mean 17 mmHg, PA mean 46 mmHg, PCWP 28 mmHg, Fick CO 4.6/CI 2.2, PVR 3.9. cMRI showed moderate LV dilation with diffuse hypokinesis, EF 21%. No LV thrombus. Mild RV diliation with EF 23%. Nonspecific LGE at the RV insertion site, this is nonspecific and can be seen with pressure/volume overload. T1 parameters within normal limits. He was diuresed w/ IV Lasix and GDMT initiated. Discharged home, weight 233 lbs.   Recently presented to AHF Clinic for post hospital HF follow up with his wife and daughter on 10/11/21. Overall was feeling fine. No SOB with walking to mailbox and back. Denied palpitations, CP, dizziness, edema, or PND/Orthopnea. Appetite was ok. No fever or chills. Weight at home was 232 pounds. Reported taking all medications. 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     Today he returns to HF clinic for pharmacist medication titration. At last visit with APP, Sherryll Burger was increased to 49/51 mg twice daily. Overall he is feeling well today. No dizziness, lightheadedness, chest pain or palpitations. SOB has improved. Can now walk around [employer] without becoming SOB. Weight has been stable at home, 230-233 lbs. Has only used 1 dose of PRN Lasix in the last month. No LEE, PND or orthopnea. SBP at home after medications is 125-130. BP in clinic 140/84. New insurance, changed from [place] to [place].    HF Medications: Carvedilol 6.25 mg BID Entresto 49/51 mg BID Spironolactone 12.5 mg daily Farxiga 10 mg daily Digoxin 0.125 mg daily Lasix 40 mg  PRN  Has the patient been experiencing any side effects to the medications prescribed?  no  Does the patient have any problems obtaining medications due to transportation or finances?   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 Copay card for Marcelline Deist is not paying enough to make cost affordable. Copay is still >$200 after using copay card. Plan to change Farxiga to [place] and apply for patient assistance. BI Cares is more likely to approve a commercially insured patient with a large copay than [employer].   Understanding of regimen: good Understanding of indications: good Potential of compliance: good Patient understands to avoid NSAIDs. Patient understands to avoid decongestants.    Pertinent Lab Values: 11/01/21: Serum creatinine 1.01, BUN 18, Potassium 4.0, Sodium 137  Vital Signs: Weight: 238.6 lbs (last clinic weight: 237.2 lbs) Blood pressure: 140/84  Heart rate: 78   Assessment/Plan: . Chronic systolic HF/ new NICM - viral illness end of November 2022. Worsening dyspnea 1 week PTA - Echo (09/2021): EF 25-35%, RV moderately reduced - No arrhythmias to explain CM. BP not optimally controlled on admit.  - R/LHC (09/2021): minimal nonobstructive CAD, RA mean 17 mmHg, PA mean 46 mmHg, PCWP 28 mmHg, Fick CO 4.6/CI 2.2, PVR 3.9 - cMRI (09/2021):  Moderate LV dilation with diffuse hypokinesis, EF 21%, RV EF 23%, nonspecific LGE at the RV insertion site.  T1 parameters within normal limits. - NYHA II, euvolemic on exam - Continue Lasix 40 mg daily PRN. - Continue carvedilol 6.25 mg BID. - Increase Entresto to 97/103 mg BID. Repeat BMET in  3 weeks.  - Continue Spironolactone 12.5 mg daily. - Stop Wilder Glade and start Jardiance 10 mg daily (insurance preference, see above for details).  - Continue digoxin 0.125 mg daily. - Continue to weigh daily, limit fluids to <2L/day and sodium to <2g/day. - Repeat echo scheduled for 12/2020   2. HTN  - BP not at goal. - Increase Entresto as above.  3. CAD -  Nonobstructive on cath. - No chest pain - Continue atorvastatin 40 mg daily.   4. OSA - Has not been using CPAP regularly. - home sleep study results pending.   5. Obesity - Body mass index is 39.47 kg/m. - Discussed light physical activity is OK (walking, swimming), keep HR <133, no heavy lifting or strenuous exercise. - Portion control discussed. - Consider pharmacy referral for semaglutide down the road.  Follow up 3 weeks with [place], PharmD, BCPS, BCCP, CPP Heart Failure Clinic Pharmacist [phone]

## 2021-10-23 ENCOUNTER — Other Ambulatory Visit (HOSPITAL_COMMUNITY): Payer: Self-pay

## 2021-10-29 ENCOUNTER — Telehealth (HOSPITAL_COMMUNITY): Payer: Self-pay | Admitting: Surgery

## 2021-10-29 ENCOUNTER — Other Ambulatory Visit (HOSPITAL_COMMUNITY): Payer: Self-pay

## 2021-10-29 ENCOUNTER — Telehealth (HOSPITAL_COMMUNITY): Payer: Self-pay | Admitting: Pharmacy Technician

## 2021-10-29 NOTE — Telephone Encounter (Signed)
Advanced Heart Failure Patient Advocate Encounter  Patient called in requesting help with [place] co-pay. Upon further investigation his insurance will leave a high copay even with the copay card. Will supply one month of Farxiga samples and get him to sign Jardiance assistance application when he comes to see [employer]. He is aware that after we obtain an approval for Jardiance we will switch from the [place].   Will fax in [place] and [place] applications this week.  Will follow up.

## 2021-10-29 NOTE — Telephone Encounter (Signed)
I called patient after receiving message that his insurance information had not been updated and may be necessary for ordered home sleep study.Michael Hester  He tells me that he spoke to clinic pharmacy team and that the information should be noted in is chart however I do not see it.  I will send the information to [place] to check for precert for the ordered home sleep study. He was thankful for the call.

## 2021-10-29 NOTE — Telephone Encounter (Signed)
I called Michael Hester back to let him know that per Ceasar Lund CMA insurance precert is not needed and he can proceed with ordered home sleep study.  He plans to complete in the next few nights.

## 2021-10-30 ENCOUNTER — Encounter (INDEPENDENT_AMBULATORY_CARE_PROVIDER_SITE_OTHER): Payer: Self-pay | Admitting: Cardiology

## 2021-10-30 DIAGNOSIS — G4733 Obstructive sleep apnea (adult) (pediatric): Secondary | ICD-10-CM

## 2021-10-30 NOTE — Procedures (Signed)
° °  Sleep Study Report  Patient Information Study Date: 10/30/21 Patient Name: Michael Hester Patient ID: [id number] Birth Date: 07/17/2066 Age: 56 Gender: Male Referring Physician: Glori Bickers, MD  TEST DESCRIPTION: Home sleep apnea testing was completed using the WatchPat, a Type 1 device, utilizing peripheral arterial tonometry (PAT), chest movement, actigraphy, pulse oximetry, pulse rate, body position and snore. AHI was calculated with apnea and hypopnea using valid sleep time as the denominator. RDI includes apneas, hypopneas, and RERAs. The data acquired and the scoring of sleep and all associated events were performed in accordance with the recommended standards and specifications as outlined in the AASM Manual for the Scoring of Sleep and Associated Events 2.2.0 (2015).  FINDINGS: 1. Moderate Obstructive Sleep Apnea with AHI 24.6/hr. 2. No significant Central Sleep Apnea with pAHIc 2.7/hr. 3. Oxygen desaturations as low as 83%. 4. Severe snoring was present. O2 sats were < 88% for3.9 min. 5. Total sleep time was 7 hrs and 1 min. 6. 28.6 % of total sleep time was spent in REM sleep. 7. Shortened sleep onset latency at 6 min 8. Shortened REM sleep onset latency at 57 min. 9. Total awakenings were 12.  DIAGNOSIS: Moderate Obstructive Sleep Apnea (G47.33)  RECOMMENDATIONS: 1. Clinical correlation of these findings is necessary. The decision to treat obstructive sleep apnea (OSA) is usually based on the presence of apnea symptoms or the presence of associated medical conditions such as Hypertension, Congestive Heart Failure, Atrial Fibrillation or Obesity. The most common symptoms of OSA are snoring, gasping for breath while sleeping, daytime sleepiness and fatigue.  2. Initiating apnea therapy is recommended given the presence of symptoms and/or associated conditions. Recommend proceeding with one of the following:   a. Auto-CPAP therapy with a pressure range of  5-20cm H2O.   b. An oral appliance (OA) that can be obtained from certain dentists with expertise in sleep medicine. These are primarily of use in non-obese patients with mild and moderate disease.   c. An ENT consultation which may be useful to look for specific causes of obstruction and possible treatment options.   d. If patient is intolerant to PAP therapy, consider referral to ENT for evaluation for hypoglossal nerve stimulator. 3. Close follow-up is necessary to ensure success with CPAP or oral appliance therapy for maximum benefit .  4. A follow-up oximetry study on CPAP is recommended to assess the adequacy of therapy and determine the need for supplemental oxygen or the potential need for Bi-level therapy. An arterial blood gas to determine the adequacy of baseline ventilation and oxygenation should also be considered.  5. Healthy sleep recommendations include: adequate nightly sleep (normal 7-9 hrs/night), avoidance of caffeine after noon and alcohol near bedtime, and maintaining a sleep environment that is cool, dark and quiet.  6. Weight loss for overweight patients is recommended. Even modest amounts of weight loss can significantly improve the severity of sleep apnea.  7. Snoring recommendations include: weight loss where appropriate, side sleeping, and avoidance of alcohol before bed.  8. Operation of motor vehicle should not be performed when sleepy.  Signature: Electronically Signed: 10/30/21 Fransico Him, MD; [facility]; [place], American Board of Sleep Medicine

## 2021-10-31 ENCOUNTER — Telehealth (HOSPITAL_COMMUNITY): Payer: Self-pay | Admitting: Pharmacist

## 2021-10-31 NOTE — Telephone Encounter (Addendum)
Medication Samples have been provided to the patient.   Drug name: Marcelline Deist     Strength: 10 mg       Qty: 4 bottles                 LOT: [id number] Exp.Date: 04/2024   Dosing instructions: 1 tablet daily   The patient has been instructed regarding the correct time, dose, and frequency of taking this medication, including desired effects and most common side effects.   Karle Plumber, PharmD, BCPS, CPP Heart Failure Clinic Pharmacist [phone]

## 2021-11-01 ENCOUNTER — Ambulatory Visit (HOSPITAL_COMMUNITY)
Admission: RE | Admit: 2021-11-01 | Discharge: 2021-11-01 | Disposition: A | Discharge status: Home / Self Care | Payer: BC Managed Care – PPO | Source: Ambulatory Visit | Attending: Internal Medicine | Admitting: Internal Medicine

## 2021-11-01 ENCOUNTER — Other Ambulatory Visit (HOSPITAL_COMMUNITY): Payer: Self-pay

## 2021-11-01 ENCOUNTER — Other Ambulatory Visit: Payer: Self-pay

## 2021-11-01 VITALS — BP 140/84 | HR 78 | Wt 238.6 lb

## 2021-11-01 DIAGNOSIS — E669 Obesity, unspecified: Secondary | ICD-10-CM | POA: Diagnosis not present

## 2021-11-01 DIAGNOSIS — I428 Other cardiomyopathies: Secondary | ICD-10-CM | POA: Insufficient documentation

## 2021-11-01 DIAGNOSIS — I110 Hypertensive heart disease with heart failure: Secondary | ICD-10-CM | POA: Insufficient documentation

## 2021-11-01 DIAGNOSIS — G4733 Obstructive sleep apnea (adult) (pediatric): Secondary | ICD-10-CM | POA: Insufficient documentation

## 2021-11-01 DIAGNOSIS — I5022 Chronic systolic (congestive) heart failure: Secondary | ICD-10-CM | POA: Diagnosis not present

## 2021-11-01 DIAGNOSIS — I2510 Atherosclerotic heart disease of native coronary artery without angina pectoris: Secondary | ICD-10-CM | POA: Insufficient documentation

## 2021-11-01 DIAGNOSIS — Z6839 Body mass index (BMI) 39.0-39.9, adult: Secondary | ICD-10-CM | POA: Insufficient documentation

## 2021-11-01 LAB — BASIC METABOLIC PANEL
Anion gap: 12 (ref 5–15)
BUN: 18 mg/dL (ref 6–20)
CO2: 24 mmol/L (ref 22–32)
Calcium: 9 mg/dL (ref 8.9–10.3)
Chloride: 101 mmol/L (ref 98–111)
Creatinine, Ser: 1.01 mg/dL (ref 0.61–1.24)
GFR, Estimated: >60 mL/min (ref >60)
Glucose, Bld: 90 mg/dL (ref 70–99)
Potassium: 4 mmol/L (ref 3.5–5.1)
Sodium: 137 mmol/L (ref 135–145)

## 2021-11-01 MED ORDER — SACUBITRIL-VALSARTAN 97-103 MG PO TABS: 1.0000 | ORAL_TABLET | Freq: Two times a day (BID) | ORAL | 11 refills | Status: DC

## 2021-11-01 MED ORDER — EMPAGLIFLOZIN 10 MG PO TABS: 10.0000 mg | ORAL_TABLET | Freq: Every day | ORAL | 11 refills | Status: DC

## 2021-11-01 NOTE — Patient Instructions (Addendum)
It was a pleasure seeing you today!  MEDICATIONS: -We are changing your medications today -Increase Entresto to 97/103 mg (1 tablet) twice daily. You may take 2 tablets of the 49/51 mg strength twice daily until you receive the new strength.  -Stop Marcelline Deist and start Jardiance 10 mg (1 tablet) daily -Call if you have questions about your medications.  LABS: -We will call you if your labs need attention.  NEXT APPOINTMENT: Return to clinic in 3 weeks with [facility].  In general, to take care of your heart failure: -Limit your fluid intake to 2 Liters (half-gallon) per day.   -Limit your salt intake to ideally 2-3 grams (2000-3000 mg) per day. -Weigh yourself daily and record, and bring that "weight diary" to your next appointment.  (Weight gain of 2-3 pounds in 1 day typically means fluid weight.) -The medications for your heart are to help your heart and help you live longer.   -Please contact us before stopping any of your heart medications.  Call the clinic at [phone] with questions or to reschedule future appointments.

## 2021-11-01 NOTE — Telephone Encounter (Signed)
Advanced Heart Failure Patient Advocate Encounter  Upon further review, the Entresto co-pay card will help cover the patient's co-pay even though his insurance is not paying anything towards the medication at this time, this will effective help towards the patient's deductible as well.   BIN [id number] PCN [id number] [id number] ID [id number]

## 2021-11-08 ENCOUNTER — Telehealth: Payer: Self-pay | Admitting: *Deleted

## 2021-11-08 NOTE — Telephone Encounter (Signed)
Left message for patient to return a call to me to discuss order for his CPAP machine .

## 2021-11-08 NOTE — Telephone Encounter (Signed)
-----   Message from Dorothea G Jones, CMA sent at 10/31/2021  6:23 PM EST ----- ° °----- Message ----- °From: Turner, Traci R, MD °Sent: 10/30/2021   2:04 PM EST °To: Dorothea G Jones, CMA ° °Order ResMed CPAP on auto from 4 to 15cm H2O with heated humidity, mask of choice and get an overnight pulse ox on PAP therapy.  Followup in 6 weeks with me.  °  ° ° °

## 2021-11-14 ENCOUNTER — Ambulatory Visit: Payer: BC Managed Care – PPO

## 2021-11-14 DIAGNOSIS — G4733 Obstructive sleep apnea (adult) (pediatric): Secondary | ICD-10-CM

## 2021-11-14 NOTE — Telephone Encounter (Signed)
-----   Message from Reesa Chew, CMA sent at 10/31/2021  6:23 PM EST -----  ----- Message ----- From: Quintella Reichert, MD Sent: 10/30/2021   2:04 PM EST To: Reesa Chew, CMA  Order ResMed CPAP on auto from 4 to 15cm H2O with heated humidity, mask of choice and get an overnight pulse ox on PAP therapy.  Followup in 6 weeks with me.

## 2021-11-14 NOTE — Telephone Encounter (Signed)
Left message to return a call to me to discuss sleep study and CPAP machine order.

## 2021-11-15 ENCOUNTER — Other Ambulatory Visit: Payer: Self-pay

## 2021-11-15 DIAGNOSIS — G4733 Obstructive sleep apnea (adult) (pediatric): Secondary | ICD-10-CM

## 2021-11-15 NOTE — Telephone Encounter (Signed)
Patient returned a call to me and we discussed Dr Malachy Mood recommendations. Patient states that he already has a AirSense 10 auto unit that he purchased from [employer] some years ago, but he  have not used it for a few years. He gave me the serial number, however I could not link it to [employer]. The patient states that he is not interested in purchasing a new machine. He wants to use the machine he currently has. Upon reviewing the serial number his machine is not 5G compatible. He would need to take the machine into the office and have it set to the current requested settings since he is not currently linked into any DME company.Ordering provider will be notified of the patients request.

## 2021-11-15 NOTE — Telephone Encounter (Signed)
-----   Message from Dorothea G Jones, CMA sent at 10/31/2021  6:23 PM EST ----- ° °----- Message ----- °From: Turner, Traci R, MD °Sent: 10/30/2021   2:04 PM EST °To: Dorothea G Jones, CMA ° °Order ResMed CPAP on auto from 4 to 15cm H2O with heated humidity, mask of choice and get an overnight pulse ox on PAP therapy.  Followup in 6 weeks with me.  °  ° ° °

## 2021-11-16 NOTE — Telephone Encounter (Signed)
Advanced Heart Failure Patient Advocate Encounter  Sent in BI Cares application via fax.  Will follow up.  

## 2021-11-21 NOTE — Progress Notes (Signed)
ADVANCED HF CLINIC CONSULT NOTE   Primary Care: Michael Found, MD HF Cardiologist: Dr. Gala Romney  HPI: [name] is a 56 y.o.male with history of HTN, OSA and new diagnosis of systolic HF  Admitted 12/22 to [facility] with new CHF. Echo showed biventricular failure. LVEF 25-35%. RV moderately reduced, GIIDD, trivial MR. He was transferred to [facility] and AHF consulted. He underwent R/LHC showed minimal nonobstructive CAD, RA mean 17 mmHg, PA mean 46 mmHg, PCWP 28 mmHg, Fick CO 4.6/CI 2.2, PVR 3.9. cMRI showed moderate LV dilation with diffuse hypokinesis, EF 21%. No LV thrombus. Mild RV diliation with EF 23%. Nonspecific LGE at the RV insertion site, this is nonspecific and can be seen with pressure/volume overload. T1 parameters within normal limits. He was diuresed w/ IV Lasix and GDMT initiated. Discharged home, weight 233 lbs.  He is here today for close follow-up. Denies chest pain or palpitations. May notice occasional dyspnea with heavier activity. Has not been getting much activity, wondering about exercise. Wants to walk on a treadmill. No orthopnea, PND or lower extremity edema. Weight at home 227 lb, 232 lb at last visit. Tolerating all medications.  ECG: SR 84 bpm  Cardiac Studies: - R/LHC 09/25/21   Ost LM to Mid LM lesion is 20% stenosed.   Prox LAD to Mid LAD lesion is 40% stenosed.   The left ventricular ejection fraction is less than 25% by visual estimate.   Findings: Ao = 132/96 (112) LV = 133/34 RA = 17 RV = 57/24 PA = 62/34 (46) PCW = 28 Fick cardiac output/index = 4.6/2.2 PVR = 3.9 FA sat = 99% PA sat = 68%, 71%   Assessment: 1. Minimal non-obstructive CAD 2. Severe NICM EF < 20% with markedly elevated filling pressures   - Cardiac MRI 09/27/21 IMPRESSION: 1. Moderate LV dilation with diffuse hypokinesis, EF 21%. No LV thrombus. 2.  Mild RV diliation with EF 23%. 3. Nonspecific LGE at the RV insertion site, this is nonspecific and can be seen with  pressure/volume overload. 4.  T1 parameters within normal limits.  Review of Systems: Cardiac and Respiratory. ROS negative except as mentioned in HPI  Past Medical History:  Diagnosis Date   Hypertension    OSA (obstructive sleep apnea)    Current Outpatient Medications  Medication Sig Dispense Refill   aspirin 81 MG EC tablet Take 1 tablet (81 mg total) by mouth daily. Swallow whole. 30 tablet 11   atorvastatin (LIPITOR) 40 MG tablet Take 1 tablet (40 mg total) by mouth daily. 30 tablet 5   carvedilol (COREG) 6.25 MG tablet Take 1 tablet (6.25 mg total) by mouth 2 (two) times daily with a meal. 60 tablet 5   dapagliflozin propanediol (FARXIGA) 10 MG TABS tablet Take 1 tablet (10 mg total) by mouth daily. 30 tablet 5   digoxin (LANOXIN) 0.125 MG tablet Take 1 tablet (0.125 mg total) by mouth daily. 30 tablet 5   furosemide (LASIX) 40 MG tablet Take 1 tablet (40 mg total) by mouth daily as needed. For 3 lb weight gain in 24 hr or 5 lb weigh gain in 1 week 30 tablet 11   sacubitril-valsartan (ENTRESTO) 97-103 MG Take 1 tablet by mouth 2 (two) times daily. 60 tablet 11   empagliflozin (JARDIANCE) 10 MG TABS tablet Take 1 tablet (10 mg total) by mouth daily before breakfast. (Patient not taking: Reported on 11/22/2021) 30 tablet 11   spironolactone (ALDACTONE) 25 MG tablet Take 1 tablet (25 mg total) by  mouth daily. 30 tablet 5   No current facility-administered medications for this encounter.   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     Social Determinants of Health   Financial Resource Strain: Not on file  Food Insecurity: Not on file  Transportation Needs: Not on file  Physical Activity: Not on file  Stress: Not on file  Social Connections: Not on file  Intimate Partner Violence: Not on file   Family History  Problem Relation Age of Onset   Gout Mother    Diabetes Father    Kidney failure Father    BP 118/80    Pulse 96    Wt 107.2 kg (236 lb 6.4 oz)    SpO2 96%    BMI 39.34 kg/m   Wt Readings from Last 3 Encounters:  11/22/21 107.2 kg (236 lb 6.4 oz)  11/01/21 108.2 kg (238 lb 9.6 oz)  10/11/21 107.6 kg (237 lb 3.2 oz)   PHYSICAL EXAM: General:  Well appearing. No resp difficulty HEENT: normal Neck: supple. JVD difficult to assess d/t neck size. Carotids 2+ bilat; no bruits. No lymphadenopathy or thryomegaly appreciated. Cor: PMI nondisplaced. Regular rate & rhythm. No rubs, gallops or murmurs. Lungs: clear Abdomen: soft, nontender, nondistended. No hepatosplenomegaly.  Extremities: no cyanosis, clubbing, rash, edema Neuro: alert & orientedx3, cranial nerves grossly intact. moves all 4 extremities w/o difficulty. Affect pleasant    ASSESSMENT & PLAN:  1. Chronic systolic HF/ new NICM - viral illness end of November 2022. Worsening dyspnea 1 week PTA - Echo (12/22): EF 25-35%, RV moderately reduced - No arrhythmias to explain CM. BP not optimally controlled on admit.  - R/LHC (12/22): minimal nonobstructive CAD, RA mean 17 mmHg, PA mean 46 mmHg, PCWP 28 mmHg, Fick CO 4.6/CI 2.2, PVR 3.9 - cMRI (12/22):  Moderate LV dilation with diffuse hypokinesis, EF 21%, RV EF 23%, nonspecific LGE at the RV insertion site.  T1 parameters within normal limits. - NYHA II, volume looks good today. Has lasix to use PRN - Continue Entresto 97/103 mg BID - Continue digoxin 0.125 mg daily. - Continue Coreg 6.25 mg bid. - Increase spiro to 25 mg daily - Continue  Farxiga 10 mg daily  - BMET, BNP and digoxin level in 1 week - Repeat echo at f/u next month   2. HTN - BP at goal   3. CAD - Nonobstructive on cath. - No chest pain - Continue atorvastatin 40 mg daily.   4. OSA - Moderate OSA on recent sleep eval - Dr. Mayford Knife arranging CPAP  5. Obesity - Body mass index is 39.34 kg/m. - Okay to start a walking routine. Avoid strenuous exercise - Consider pharmacy referral for semaglutide down the road.    Follow up: Dr. Gala Romney 03/23 with echo  Provided note to stay out of work until follow-up next month. Currently on STD. Has not been  very active recently. Encouraged him to start walking routine. If able to tolerate activity over next few weeks, can likely return to work.   Anna Genre, PA-C 11/22/21

## 2021-11-22 ENCOUNTER — Other Ambulatory Visit: Payer: Self-pay

## 2021-11-22 ENCOUNTER — Ambulatory Visit (HOSPITAL_COMMUNITY)
Admission: RE | Admit: 2021-11-22 | Discharge: 2021-11-22 | Disposition: A | Discharge status: Home / Self Care | Payer: BC Managed Care – PPO | Source: Ambulatory Visit | Attending: Physician Assistant | Admitting: Physician Assistant

## 2021-11-22 ENCOUNTER — Encounter (HOSPITAL_COMMUNITY): Payer: Self-pay

## 2021-11-22 VITALS — BP 118/80 | HR 96 | Wt 236.4 lb

## 2021-11-22 DIAGNOSIS — I10 Essential (primary) hypertension: Secondary | ICD-10-CM | POA: Diagnosis not present

## 2021-11-22 DIAGNOSIS — G4733 Obstructive sleep apnea (adult) (pediatric): Secondary | ICD-10-CM | POA: Diagnosis not present

## 2021-11-22 DIAGNOSIS — E669 Obesity, unspecified: Secondary | ICD-10-CM | POA: Insufficient documentation

## 2021-11-22 DIAGNOSIS — I5022 Chronic systolic (congestive) heart failure: Secondary | ICD-10-CM | POA: Insufficient documentation

## 2021-11-22 DIAGNOSIS — Z79899 Other long term (current) drug therapy: Secondary | ICD-10-CM | POA: Insufficient documentation

## 2021-11-22 DIAGNOSIS — Z7984 Long term (current) use of oral hypoglycemic drugs: Secondary | ICD-10-CM | POA: Diagnosis not present

## 2021-11-22 DIAGNOSIS — Z6839 Body mass index (BMI) 39.0-39.9, adult: Secondary | ICD-10-CM | POA: Insufficient documentation

## 2021-11-22 DIAGNOSIS — I110 Hypertensive heart disease with heart failure: Secondary | ICD-10-CM | POA: Diagnosis not present

## 2021-11-22 DIAGNOSIS — I2510 Atherosclerotic heart disease of native coronary artery without angina pectoris: Secondary | ICD-10-CM | POA: Insufficient documentation

## 2021-11-22 MED ORDER — SPIRONOLACTONE 25 MG PO TABS: 25.0000 mg | ORAL_TABLET | Freq: Every day | ORAL | 5 refills | Status: DC

## 2021-11-22 NOTE — Telephone Encounter (Signed)
Received communication from [facility] that they would like physical POI from the patient to continue processing the application. Went ahead and sent that in via fax.

## 2021-11-22 NOTE — Patient Instructions (Addendum)
Thank you for coming in today  Labs were done today, if any labs are abnormal the clinic will call you  Your physician recommends that you schedule a follow-up appointment in: keep follow up appointment already scheduled   Your physician recommends that you return for lab work in: 1 week  INCREASE Spirolactone to 25 mg 1 tablet daily  At the Advanced Heart Failure Clinic, you and your health needs are our priority. As part of our continuing mission to provide you with exceptional heart care, we have created designated Provider Care Teams. These Care Teams include your primary Cardiologist (physician) and Advanced Practice Providers (APPs- Physician Assistants and Nurse Practitioners) who all work together to provide you with the care you need, when you need it.   You may see any of the following providers on your designated Care Team at your next follow up: Dr Arvilla Meres Dr Carron Curie, NP Robbie Lis, [place] [facility] [place], [place] Karle Plumber, PharmD   Please be sure to bring in all your medications bottles to every appointment.    If you have any questions or concerns before your next appointment please send [place] a message through [place] or call our office at [phone].    TO LEAVE A MESSAGE FOR THE NURSE SELECT OPTION 2, PLEASE LEAVE A MESSAGE INCLUDING: YOUR NAME DATE OF BIRTH CALL BACK NUMBER REASON FOR CALL**this is important as we prioritize the call backs  YOU WILL RECEIVE A CALL BACK THE SAME DAY AS LONG AS YOU CALL BEFORE 4:00 PM

## 2021-11-26 ENCOUNTER — Other Ambulatory Visit (HOSPITAL_COMMUNITY): Payer: Self-pay | Admitting: *Deleted

## 2021-11-26 NOTE — Telephone Encounter (Signed)
Advanced Heart Failure Patient Advocate Encounter  The patient was denied for Jardiance assistance due to being over the eligibility income limit. He started [place] today (free 30 day trial). He is aware to refill Entresto at the pharmacy first, try and fill Jardiance and check if the deductible has been met or is affordable with the addition of the copay card. If not, will provide a month of samples in the meantime.  Charlann Boxer, CPhT

## 2021-11-29 ENCOUNTER — Other Ambulatory Visit: Payer: Self-pay

## 2021-11-29 ENCOUNTER — Ambulatory Visit (HOSPITAL_COMMUNITY)
Admission: RE | Admit: 2021-11-29 | Discharge: 2021-11-29 | Disposition: A | Discharge status: Home / Self Care | Payer: BC Managed Care – PPO | Source: Ambulatory Visit | Attending: Internal Medicine | Admitting: Internal Medicine

## 2021-11-29 DIAGNOSIS — I5022 Chronic systolic (congestive) heart failure: Secondary | ICD-10-CM | POA: Diagnosis not present

## 2021-11-29 LAB — DIGOXIN LEVEL: Digoxin Level: 0.5 ng/mL — ABNORMAL LOW (ref 0.8–2.0)

## 2021-11-29 LAB — BASIC METABOLIC PANEL
Anion gap: 11 (ref 5–15)
BUN: 16 mg/dL (ref 6–20)
CO2: 22 mmol/L (ref 22–32)
Calcium: 9.3 mg/dL (ref 8.9–10.3)
Chloride: 106 mmol/L (ref 98–111)
Creatinine, Ser: 0.92 mg/dL (ref 0.61–1.24)
GFR, Estimated: >60 mL/min (ref >60)
Glucose, Bld: 87 mg/dL (ref 70–99)
Potassium: 3.9 mmol/L (ref 3.5–5.1)
Sodium: 139 mmol/L (ref 135–145)

## 2021-11-29 NOTE — Telephone Encounter (Signed)
Ok per Dr Mayford Knife for patient to use his old machine. Order sent to [facility] to schedule patient to come in to have his machine set to the settings requested by Dr Mayford Knife.

## 2021-12-03 ENCOUNTER — Telehealth (HOSPITAL_COMMUNITY): Payer: Self-pay

## 2021-12-03 NOTE — Telephone Encounter (Signed)
Disability forms faxed on 12/03/2021

## 2021-12-04 DIAGNOSIS — G4733 Obstructive sleep apnea (adult) (pediatric): Secondary | ICD-10-CM | POA: Diagnosis not present

## 2021-12-05 DIAGNOSIS — G4733 Obstructive sleep apnea (adult) (pediatric): Secondary | ICD-10-CM | POA: Diagnosis not present

## 2021-12-06 ENCOUNTER — Encounter (HOSPITAL_COMMUNITY): Payer: Self-pay | Admitting: *Deleted

## 2021-12-10 ENCOUNTER — Other Ambulatory Visit (HOSPITAL_COMMUNITY): Payer: Self-pay

## 2021-12-11 ENCOUNTER — Other Ambulatory Visit (HOSPITAL_COMMUNITY): Payer: Self-pay | Admitting: *Deleted

## 2021-12-11 MED ORDER — SACUBITRIL-VALSARTAN 97-103 MG PO TABS: 1.0000 | ORAL_TABLET | Freq: Two times a day (BID) | ORAL | 3 refills | Status: DC

## 2021-12-13 ENCOUNTER — Telehealth (HOSPITAL_COMMUNITY): Payer: Self-pay | Admitting: Pharmacist

## 2021-12-13 NOTE — Telephone Encounter (Signed)
Medication Samples have been provided to the patient. ?  ?Drug name: Jardiance     Strength: 10 mg       ?Qty: 4 boxes                 LOT: [id number] Exp.Date: 10/2023 ?  ?  ?The patient has been instructed regarding the correct time, dose, and frequency of taking this medication, including desired effects and most common side effects.  ? ?Karle Plumber, PharmD, BCPS, CPP ?Heart Failure Clinic Pharmacist ?[phone] ? ? ? ?

## 2021-12-24 ENCOUNTER — Telehealth (HOSPITAL_COMMUNITY): Payer: Self-pay | Admitting: Vascular Surgery

## 2021-12-24 NOTE — Telephone Encounter (Signed)
Lvm to move 3/23 appt / db will be out of office ?

## 2022-01-02 ENCOUNTER — Encounter (HOSPITAL_COMMUNITY): Payer: Self-pay | Admitting: *Deleted

## 2022-01-03 ENCOUNTER — Ambulatory Visit (HOSPITAL_COMMUNITY): Payer: BC Managed Care – PPO

## 2022-01-03 ENCOUNTER — Encounter (HOSPITAL_COMMUNITY): Payer: Managed Care, Other (non HMO) | Admitting: Internal Medicine

## 2022-01-10 NOTE — Progress Notes (Signed)
? ?ADVANCED HF CLINIC NOTE ? ? ?Primary Care: Assunta Found, MD ?HF Cardiologist: Dr. Gala Romney ? ?HPI: ?Mr. Sebo is a 56 y.o.male with history of HTN, OSA and  systolic HF ? ?Admitted 12/22 to [facility] with new CHF. Echo EF 25-35%. RV moderately reduced, GIIDD, trivial MR. R/LHC showed minimal nonobstructive CAD, RA 17 PA mean 46 PCWP 28, Fick  4.6/2.2, PVR 3.9. cMRI EF 21%. No LV thrombus. Mild RV diliation with EF 23%. Nonspecific LGE at the RV insertion site. Discharged home, weight 233 lbs. ? ?He is here today for follow-up. Feels good. Denies CP or SOB. No edema. Now on CPAP ? ?Echo today 01/11/22 EF 55-60% ? ?Cardiac Studies: ?- [facility] 09/25/21 ?  Ost LM to Mid LM lesion is 20% stenosed. ?  Prox LAD to Mid LAD lesion is 40% stenosed. ?  The left ventricular ejection fraction is less than 25% by visual estimate. ?  ?Findings: ?Ao = 132/96 (112) ?LV = 133/34 ?RA = 17 ?RV = 57/24 ?PA = 62/34 (46) ?PCW = 28 ?Fick cardiac output/index = 4.6/2.2 ?PVR = 3.9 ?FA sat = 99% ?PA sat = 68%, 71% ?  ?Assessment: ?1. Minimal non-obstructive CAD ?2. Severe NICM EF < 20% with markedly elevated filling pressures ?  ?- Cardiac MRI 09/27/21 ?IMPRESSION: ?1. Moderate LV dilation with diffuse hypokinesis, EF 21%. No LV ?thrombus. ?2.  Mild RV diliation with EF 23%. ?3. Nonspecific LGE at the RV insertion site, this is nonspecific and ?can be seen with pressure/volume overload. ?4.  T1 parameters within normal limits. ? ?Review of Systems: Cardiac and Respiratory. ROS negative except as mentioned in HPI ? ?Past Medical History:  ?Diagnosis Date  ? Hypertension   ? OSA (obstructive sleep apnea)   ? ?Current Outpatient Medications  ?Medication Sig Dispense Refill  ? aspirin 81 MG EC tablet Take 1 tablet (81 mg total) by mouth daily. Swallow whole. 30 tablet 11  ? atorvastatin (LIPITOR) 40 MG tablet Take 1 tablet (40 mg total) by mouth daily. 30 tablet 5  ? carvedilol (COREG) 6.25 MG tablet Take 1 tablet (6.25 mg total) by mouth 2 (two)  times daily with a meal. 60 tablet 5  ? digoxin (LANOXIN) 0.125 MG tablet Take 1 tablet (0.125 mg total) by mouth daily. 30 tablet 5  ? empagliflozin (JARDIANCE) 10 MG TABS tablet Take 1 tablet (10 mg total) by mouth daily before breakfast. 30 tablet 11  ? furosemide (LASIX) 40 MG tablet Take 1 tablet (40 mg total) by mouth daily as needed. For 3 lb weight gain in 24 hr or 5 lb weigh gain in 1 week 30 tablet 11  ? sacubitril-valsartan (ENTRESTO) 97-103 MG Take 1 tablet by mouth 2 (two) times daily. 180 tablet 3  ? spironolactone (ALDACTONE) 25 MG tablet Take 1 tablet (25 mg total) by mouth daily. 30 tablet 5  ? ?No current facility-administered medications for this encounter.  ? ?No Known Allergies ?Social History  ? ?Socioeconomic History  ? Marital status: Married  ?  Spouse name: Not on file  ? Number of children: 4  ? Years of education: Not on file  ? Highest education level: Not on file  ?Occupational History  ? Occupation: [employer]  ?  Comment: Works in a [employer]  ?Tobacco Use  ? Smoking status: Never  ? Smokeless tobacco: Never  ?Substance and Sexual Activity  ? Alcohol use: Never  ? Drug use: Never  ? Sexual activity: Never  ?Other Topics Concern  ?  Not on file  ?Social History Narrative  ? Lives with wife.    ? ?Social Determinants of Health  ? ?Financial Resource Strain: Not on file  ?Food Insecurity: Not on file  ?Transportation Needs: Not on file  ?Physical Activity: Not on file  ?Stress: Not on file  ?Social Connections: Not on file  ?Intimate Partner Violence: Not on file  ? ?Family History  ?Problem Relation Age of Onset  ? Gout Mother   ? Diabetes Father   ? Kidney failure Father   ? ?BP 118/78   Pulse 85   Wt 109.2 kg (240 lb 12.8 oz)   SpO2 97%   BMI 40.07 kg/m?  ? ?Wt Readings from Last 3 Encounters:  ?01/11/22 109.2 kg (240 lb 12.8 oz)  ?11/22/21 107.2 kg (236 lb 6.4 oz)  ?11/01/21 108.2 kg (238 lb 9.6 oz)  ? ?PHYSICAL EXAM: ?General:  Well appearing. No resp difficulty ?HEENT:  normal ?Neck: supple. no JVD. Carotids 2+ bilat; no bruits. No lymphadenopathy or thryomegaly appreciated. ?Cor: PMI nondisplaced. Regular rate & rhythm. No rubs, gallops or murmurs. ?Lungs: clear ?Abdomen: soft, nontender, nondistended. No hepatosplenomegaly. No bruits or masses. Good bowel sounds. ?Extremities: no cyanosis, clubbing, rash, edema ?Neuro: alert & orientedx3, cranial nerves grossly intact. moves all 4 extremities w/o difficulty. Affect pleasant ? ? ?ASSESSMENT & PLAN:  ? ?1. 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  T1 parameters within normal limits. ?- Echo today 01/11/22 EF 55-605% ?- NYHA I. Volume ok.  ?- Continue Entresto 97/103 mg BID ?- Stop digoxin ?- Continue Coreg 6.25 mg bid. ?- Continue spiro 25 mg daily ?- Unable to afford Jardiance ?- repeat echo in 6 months to ensure stability ?- Ok to return to work  ? ?2. HTN ?- Blood pressure well controlled. Continue current regimen. ?  ?3. CAD ?- Nonobstructive on cath. ?- No s/s angina ?- Continue atorvastatin 40 mg daily. ?  ?4. OSA ?- Moderate OSA on recent sleep eval ?- Dr. Mayford Knife has arranged CPAP ? ?5. Obesity ?- Body mass index is 40.07 kg/m?. ?- Continue weight loss program ? ?Arvilla Meres, MD  ?10:10 AM ? ? ? ?

## 2022-01-11 ENCOUNTER — Ambulatory Visit (HOSPITAL_COMMUNITY)
Admission: RE | Admit: 2022-01-11 | Discharge: 2022-01-11 | Disposition: A | Discharge status: Home / Self Care | Payer: BC Managed Care – PPO | Source: Ambulatory Visit | Attending: Family Medicine | Admitting: Family Medicine

## 2022-01-11 ENCOUNTER — Ambulatory Visit (HOSPITAL_BASED_OUTPATIENT_CLINIC_OR_DEPARTMENT_OTHER)
Admission: RE | Admit: 2022-01-11 | Discharge: 2022-01-11 | Disposition: A | Discharge status: Home / Self Care | Payer: BC Managed Care – PPO | Source: Ambulatory Visit | Attending: Internal Medicine | Admitting: Internal Medicine

## 2022-01-11 ENCOUNTER — Encounter (HOSPITAL_COMMUNITY): Payer: Self-pay | Admitting: Internal Medicine

## 2022-01-11 VITALS — BP 118/78 | HR 85 | Wt 240.8 lb

## 2022-01-11 DIAGNOSIS — M069 Rheumatoid arthritis, unspecified: Secondary | ICD-10-CM | POA: Insufficient documentation

## 2022-01-11 DIAGNOSIS — I110 Hypertensive heart disease with heart failure: Secondary | ICD-10-CM | POA: Insufficient documentation

## 2022-01-11 DIAGNOSIS — Z6841 Body Mass Index (BMI) 40.0 and over, adult: Secondary | ICD-10-CM | POA: Insufficient documentation

## 2022-01-11 DIAGNOSIS — G4733 Obstructive sleep apnea (adult) (pediatric): Secondary | ICD-10-CM | POA: Insufficient documentation

## 2022-01-11 DIAGNOSIS — I10 Essential (primary) hypertension: Secondary | ICD-10-CM

## 2022-01-11 DIAGNOSIS — I5022 Chronic systolic (congestive) heart failure: Secondary | ICD-10-CM

## 2022-01-11 DIAGNOSIS — E669 Obesity, unspecified: Secondary | ICD-10-CM | POA: Insufficient documentation

## 2022-01-11 DIAGNOSIS — Z7984 Long term (current) use of oral hypoglycemic drugs: Secondary | ICD-10-CM | POA: Insufficient documentation

## 2022-01-11 DIAGNOSIS — I2510 Atherosclerotic heart disease of native coronary artery without angina pectoris: Secondary | ICD-10-CM | POA: Diagnosis not present

## 2022-01-11 DIAGNOSIS — Z79899 Other long term (current) drug therapy: Secondary | ICD-10-CM | POA: Insufficient documentation

## 2022-01-11 LAB — ECHOCARDIOGRAM COMPLETE
Area-P 1/2: 3.68 cm2
S' Lateral: 3.5 cm
Single Plane A4C EF: 50.7 %

## 2022-01-11 MED ORDER — SACUBITRIL-VALSARTAN 97-103 MG PO TABS: 1.0000 | ORAL_TABLET | Freq: Two times a day (BID) | ORAL | 3 refills | Status: DC

## 2022-01-11 MED ORDER — CARVEDILOL 6.25 MG PO TABS: 6.2500 mg | ORAL_TABLET | Freq: Two times a day (BID) | ORAL | 3 refills | Status: DC

## 2022-01-11 MED ORDER — SPIRONOLACTONE 25 MG PO TABS: 25.0000 mg | ORAL_TABLET | Freq: Every day | ORAL | 3 refills | Status: DC

## 2022-01-11 NOTE — Progress Notes (Signed)
Pt's STD forms completed and signed by Dr Gala Romney during visit, pt given copy, also provided pt with RTW letter ? ?Meredith Staggers, RN, BSN, CHFN ?Specialty Coordinator ?[facility] ? ?

## 2022-01-11 NOTE — Addendum Note (Signed)
Encounter addended by: Noralee Space, RN on: 01/11/2022 3:30 PM ? Actions taken: Clinical Note Signed

## 2022-01-11 NOTE — Addendum Note (Signed)
Encounter addended by: Noralee Space, RN on: 01/11/2022 10:28 AM ? Actions taken: Letter saved

## 2022-01-11 NOTE — Progress Notes (Signed)
Pt's STD forms faxed along with OV note and echo to [facility]. ? ?Meredith Staggers, RN, BSN, CHFN ?Specialty Coordinator ?[facility] ? ?

## 2022-01-11 NOTE — Addendum Note (Signed)
Encounter addended by: Noralee Space, RN on: 01/11/2022 10:26 AM ? Actions taken: Medication long-term status modified, Pharmacy for encounter modified, Order list changed, Clinical Note Signed

## 2022-01-11 NOTE — Patient Instructions (Signed)
STOP Digoxin ? ?STOP Jardiance ? ?We have provided you a return to work letter ? ?Your physician recommends that you schedule a follow-up appointment in: 6 months with an echocardiogram, **PLEASE CALL OUR OFFICE IN July TO SCHEDULE THESE APPOINTMENTS ? ?If you have any questions or concerns before your next appointment please send [place] a message through [place] or call our office at [phone].   ? ?TO LEAVE A MESSAGE FOR THE NURSE SELECT OPTION 2, PLEASE LEAVE A MESSAGE INCLUDING: ?YOUR NAME ?DATE OF BIRTH ?CALL BACK NUMBER ?REASON FOR CALL**this is important as we prioritize the call backs ? ?YOU WILL RECEIVE A CALL BACK THE SAME DAY AS LONG AS YOU CALL BEFORE 4:00 PM ? ?At the [facility], you and your health needs are our priority. As part of our continuing mission to provide you with exceptional heart care, we have created designated Provider Care Teams. These Care Teams include your primary Cardiologist (physician) and Advanced Practice Providers (APPs- Physician Assistants and Nurse Practitioners) who all work together to provide you with the care you need, when you need it.  ? ?You may see any of the following providers on your designated Care Team at your next follow up: ?Dr Arvilla Meres ?Dr Marca Ancona ?Tonye Becket, NP ?Robbie Lis, PA ?Jessica Milford,NP ?Anna Genre, PA ?Karle Plumber, PharmD ? ? ?Please be sure to bring in all your medications bottles to every appointment.  ? ? ?

## 2022-01-11 NOTE — Progress Notes (Signed)
?  Echocardiogram ?2D Echocardiogram has been performed. ? ?Delcie Roch ?01/11/2022, 8:51 AM ?

## 2022-03-05 DIAGNOSIS — Z6841 Body Mass Index (BMI) 40.0 and over, adult: Secondary | ICD-10-CM | POA: Diagnosis not present

## 2022-03-05 DIAGNOSIS — L259 Unspecified contact dermatitis, unspecified cause: Secondary | ICD-10-CM | POA: Diagnosis not present

## 2022-03-07 DIAGNOSIS — B3324 Viral cardiomyopathy: Secondary | ICD-10-CM | POA: Diagnosis not present

## 2022-03-07 DIAGNOSIS — Z1331 Encounter for screening for depression: Secondary | ICD-10-CM | POA: Diagnosis not present

## 2022-03-07 DIAGNOSIS — Z6841 Body Mass Index (BMI) 40.0 and over, adult: Secondary | ICD-10-CM | POA: Diagnosis not present

## 2022-03-07 DIAGNOSIS — R7309 Other abnormal glucose: Secondary | ICD-10-CM | POA: Diagnosis not present

## 2022-03-07 DIAGNOSIS — B3322 Viral myocarditis: Secondary | ICD-10-CM | POA: Diagnosis not present

## 2022-03-07 DIAGNOSIS — Z0000 Encounter for general adult medical examination without abnormal findings: Secondary | ICD-10-CM | POA: Diagnosis not present

## 2022-04-29 ENCOUNTER — Other Ambulatory Visit (HOSPITAL_COMMUNITY): Payer: Self-pay

## 2022-04-29 ENCOUNTER — Encounter (HOSPITAL_COMMUNITY): Payer: Self-pay | Admitting: Pharmacy Technician

## 2022-04-30 ENCOUNTER — Telehealth (HOSPITAL_COMMUNITY): Payer: Self-pay | Admitting: Pharmacy Technician

## 2022-04-30 ENCOUNTER — Other Ambulatory Visit (HOSPITAL_COMMUNITY): Payer: Self-pay | Admitting: *Deleted

## 2022-04-30 MED ORDER — SACUBITRIL-VALSARTAN 97-103 MG PO TABS: 1.0000 | ORAL_TABLET | Freq: Two times a day (BID) | ORAL | 3 refills | Status: DC

## 2022-04-30 NOTE — Telephone Encounter (Signed)
Advanced Heart Failure Patient Advocate Encounter  Spoke with patient regarding Entresto affordability. He has reached the annual limit on the [place] co-pay card. Sent patient docusign link for application. Patient also provided POI. Application sent to [employer] via efax.

## 2022-05-01 NOTE — Telephone Encounter (Signed)
Advanced Heart Failure Patient Advocate Encounter   Patient was approved to receive Entresto from [employer]  Effective dates: 05/01/22 through 10/13/22  Informed patient of approval via Mychart. Document scanned to chart.   Archer Asa, CPhT

## 2022-05-17 ENCOUNTER — Encounter (HOSPITAL_COMMUNITY): Payer: Self-pay

## 2022-05-17 NOTE — Progress Notes (Unsigned)
Medication Samples have been provided to the patient.  Drug name: Sherryll Burger       Strength: 49/51 mg        Qty: 2  LOT: [id number]  Exp.Date: 04/2024  Dosing instructions: Take 2 tablets Twice daily   The patient has been instructed regarding the correct time, dose, and frequency of taking this medication, including desired effects and most common side effects.   Smitty Cords Andrej Spagnoli 8:42 AM 05/17/2022

## 2022-05-30 ENCOUNTER — Telehealth (HOSPITAL_COMMUNITY): Payer: Self-pay | Admitting: Licensed Clinical Social Worker

## 2022-05-30 NOTE — Telephone Encounter (Signed)
CSW called pt to see if he still needed 2 bottles of Entresto samples left for him at front desk.  Pt reports that he has now gotten his Entresto through assistance program so no longer needs samples.  Samples returned to supply  Burna Sis, LCSW Clinical Social Worker [facility] Desk#: [phone] Cell#: [phone]

## 2022-07-04 ENCOUNTER — Other Ambulatory Visit (HOSPITAL_COMMUNITY): Payer: Self-pay | Admitting: Internal Medicine

## 2022-07-04 ENCOUNTER — Ambulatory Visit (HOSPITAL_COMMUNITY)
Admission: RE | Admit: 2022-07-04 | Discharge: 2022-07-04 | Disposition: A | Discharge status: Home / Self Care | Payer: BC Managed Care – PPO | Source: Ambulatory Visit | Attending: Internal Medicine | Admitting: Internal Medicine

## 2022-07-04 DIAGNOSIS — M25562 Pain in left knee: Secondary | ICD-10-CM

## 2022-07-04 DIAGNOSIS — I5082 Biventricular heart failure: Secondary | ICD-10-CM | POA: Diagnosis not present

## 2022-07-04 DIAGNOSIS — I5041 Acute combined systolic (congestive) and diastolic (congestive) heart failure: Secondary | ICD-10-CM | POA: Diagnosis not present

## 2022-07-04 DIAGNOSIS — Z6841 Body Mass Index (BMI) 40.0 and over, adult: Secondary | ICD-10-CM | POA: Diagnosis not present

## 2022-07-16 ENCOUNTER — Telehealth (HOSPITAL_COMMUNITY): Payer: Self-pay | Admitting: Internal Medicine

## 2022-07-25 DIAGNOSIS — E6609 Other obesity due to excess calories: Secondary | ICD-10-CM | POA: Diagnosis not present

## 2022-07-25 DIAGNOSIS — M5136 Other intervertebral disc degeneration, lumbar region: Secondary | ICD-10-CM | POA: Diagnosis not present

## 2022-07-25 DIAGNOSIS — Z6841 Body Mass Index (BMI) 40.0 and over, adult: Secondary | ICD-10-CM | POA: Diagnosis not present

## 2022-08-07 DIAGNOSIS — M5127 Other intervertebral disc displacement, lumbosacral region: Secondary | ICD-10-CM | POA: Diagnosis not present

## 2022-08-07 DIAGNOSIS — M47816 Spondylosis without myelopathy or radiculopathy, lumbar region: Secondary | ICD-10-CM | POA: Diagnosis not present

## 2022-08-07 DIAGNOSIS — M48061 Spinal stenosis, lumbar region without neurogenic claudication: Secondary | ICD-10-CM | POA: Diagnosis not present

## 2022-08-07 DIAGNOSIS — M5136 Other intervertebral disc degeneration, lumbar region: Secondary | ICD-10-CM | POA: Diagnosis not present

## 2022-08-23 DIAGNOSIS — R200 Anesthesia of skin: Secondary | ICD-10-CM | POA: Diagnosis not present

## 2022-08-23 DIAGNOSIS — Z6841 Body Mass Index (BMI) 40.0 and over, adult: Secondary | ICD-10-CM | POA: Diagnosis not present

## 2022-08-23 DIAGNOSIS — R202 Paresthesia of skin: Secondary | ICD-10-CM | POA: Diagnosis not present

## 2022-09-11 ENCOUNTER — Other Ambulatory Visit (HOSPITAL_COMMUNITY): Payer: Self-pay

## 2022-09-11 ENCOUNTER — Telehealth (HOSPITAL_COMMUNITY): Payer: Self-pay

## 2022-09-11 NOTE — Telephone Encounter (Signed)
Advanced Heart Failure Patient Advocate Encounter  Received renewal notification for [employer]). Review of this patient's chart indicates that the [facility] Co-pay card will be the best option for medication assistance at the start of 2024.  Left voicemail for patient to call back with any needs or concerns.  Burnell Blanks, CPhT Rx Patient Advocate Phone: [phone]

## 2022-10-02 DIAGNOSIS — M9903 Segmental and somatic dysfunction of lumbar region: Secondary | ICD-10-CM | POA: Diagnosis not present

## 2022-10-02 DIAGNOSIS — M9902 Segmental and somatic dysfunction of thoracic region: Secondary | ICD-10-CM | POA: Diagnosis not present

## 2022-10-02 DIAGNOSIS — M9905 Segmental and somatic dysfunction of pelvic region: Secondary | ICD-10-CM | POA: Diagnosis not present

## 2022-10-02 DIAGNOSIS — M5442 Lumbago with sciatica, left side: Secondary | ICD-10-CM | POA: Diagnosis not present

## 2022-10-10 DIAGNOSIS — M5442 Lumbago with sciatica, left side: Secondary | ICD-10-CM | POA: Diagnosis not present

## 2022-10-10 DIAGNOSIS — M9902 Segmental and somatic dysfunction of thoracic region: Secondary | ICD-10-CM | POA: Diagnosis not present

## 2022-10-10 DIAGNOSIS — M9903 Segmental and somatic dysfunction of lumbar region: Secondary | ICD-10-CM | POA: Diagnosis not present

## 2022-10-10 DIAGNOSIS — M9905 Segmental and somatic dysfunction of pelvic region: Secondary | ICD-10-CM | POA: Diagnosis not present

## 2022-10-11 DIAGNOSIS — M9905 Segmental and somatic dysfunction of pelvic region: Secondary | ICD-10-CM | POA: Diagnosis not present

## 2022-10-11 DIAGNOSIS — M5442 Lumbago with sciatica, left side: Secondary | ICD-10-CM | POA: Diagnosis not present

## 2022-10-11 DIAGNOSIS — M9903 Segmental and somatic dysfunction of lumbar region: Secondary | ICD-10-CM | POA: Diagnosis not present

## 2022-10-11 DIAGNOSIS — M9902 Segmental and somatic dysfunction of thoracic region: Secondary | ICD-10-CM | POA: Diagnosis not present

## 2022-10-29 ENCOUNTER — Telehealth: Payer: Self-pay | Admitting: Neurology

## 2022-10-29 NOTE — Telephone Encounter (Signed)
LVM and sent mychart msg informing pt of need to reschedule 11/13/22 appointment - MD out

## 2022-11-07 DIAGNOSIS — Z6841 Body Mass Index (BMI) 40.0 and over, adult: Secondary | ICD-10-CM | POA: Diagnosis not present

## 2022-11-07 DIAGNOSIS — M5136 Other intervertebral disc degeneration, lumbar region: Secondary | ICD-10-CM | POA: Diagnosis not present

## 2022-11-13 ENCOUNTER — Ambulatory Visit: Payer: BC Managed Care – PPO | Admitting: Neurology

## 2022-12-23 ENCOUNTER — Telehealth: Payer: Self-pay | Admitting: Neurology

## 2022-12-23 ENCOUNTER — Ambulatory Visit (INDEPENDENT_AMBULATORY_CARE_PROVIDER_SITE_OTHER): Payer: BC Managed Care – PPO | Admitting: Neurology

## 2022-12-23 ENCOUNTER — Encounter: Payer: Self-pay | Admitting: Neurology

## 2022-12-23 VITALS — BP 173/95 | HR 104 | Ht 65.0 in | Wt 250.6 lb

## 2022-12-23 DIAGNOSIS — M25562 Pain in left knee: Secondary | ICD-10-CM

## 2022-12-23 DIAGNOSIS — G629 Polyneuropathy, unspecified: Secondary | ICD-10-CM | POA: Diagnosis not present

## 2022-12-23 DIAGNOSIS — M5136 Other intervertebral disc degeneration, lumbar region: Secondary | ICD-10-CM

## 2022-12-23 DIAGNOSIS — Z6841 Body Mass Index (BMI) 40.0 and over, adult: Secondary | ICD-10-CM

## 2022-12-23 DIAGNOSIS — I5041 Acute combined systolic (congestive) and diastolic (congestive) heart failure: Secondary | ICD-10-CM
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
## 2022-12-23 NOTE — Patient Instructions (Signed)
Tarsal Tunnel Syndrome Rehab Ask your health care provider which exercises are safe for you. Do exercises exactly as told by your health care provider and adjust them as directed. It is normal to feel mild stretching, pulling, tightness, or discomfort as you do these exercises. Stop right away if you feel sudden pain or your pain gets worse. Do not begin these exercises until told by your health care provider. Stretching and range-of-motion exercises These exercises warm up your muscles and joints and improve the movement and flexibility of your foot. These exercises also help to relieve pain, numbness, and tingling. Gastrocnemius stretch, standing This exercise is also called a calf stretch. It stretches the muscles in the back of the lower leg (gastrocnemius). Stand with your hands against a wall. Extend your left / right leg behind you, and bend your front knee slightly. Your heels should be on the floor. Keeping your heels on the floor and your back knee straight, shift your weight toward the wall. Do not arch your back. You should feel a gentle stretch in the back of your lower leg (calf). Hold this position for __________ seconds. Return to the starting position. Repeat __________ times. Complete this exercise __________ times a day. Tibial nerve glide Sit on a stable chair with both feet on the floor. Clasp your hands together behind your back. Gently round your back and tuck your chin toward your chest. Slowly straighten your knee as far as you can without increasing your symptoms. Turn your left / right foot so that your toes are pointing outward. Slowly tip your toes toward your shin. Hold this position for __________ seconds. Slowly return to the starting position. Repeat __________ times. Complete this exercise __________ times a day. Strengthening exercises These exercises build strength and endurance in your foot. Endurance is the ability to use your muscles for a long time, even  after they get tired. Plantar flexion with band  Sit on the floor with your left / right leg extended. Loop a rubber exercise band or tube around the ball of your left / right foot. The ball of your foot is on the walking surface, right under your toes. The band or tube should be slightly tense when your foot is relaxed. Hold the two ends of the band or tube in your hands. Slowly point your toes downward, pushing them away from you (plantar flexion). Stop pushing your toes down if you have any pain. Hold this position for __________ seconds. Let the band or tube slowly pull your foot back to the starting position. Repeat __________ times. Complete this exercise __________ times a day. Ankle inversion with band Secure one end of an exercise band or tubing to a fixed object, such as a table leg or a pole, that will stay still when the band is pulled. Secure the other end of the band around your left / right foot, near your toes. Sit on the floor, facing the fixed object. The band should be slightly tense when your foot is relaxed. Make fists with your hands and put them between your knees. This will focus your strengthening at your ankle. Leading with your big toe, slowly pull your banded foot inward, toward your body (inversion). The band or tube should be adding resistance. Hold this position for __________ seconds. Let the band or tube slowly pull your foot back to the starting position. Repeat __________ times. Complete this exercise __________ times a day. Arch lifts This exercise strengthens the main muscles of your  foot (foot intrinsics). Sit in a chair with your feet flat on the floor. Keeping your big toe and your heel on the floor, lift only your arch, which is on the inner edge of your left / right foot. Do not move your knee or scrunch your toes. This is a small movement. Hold this position for __________ seconds. Slowly return to the starting position. Repeat __________ times.  Complete this exercise __________ times a day. This information is not intended to replace advice given to you by your health care provider. Make sure you discuss any questions you have with your health care provider. Document Revised: 01/18/2021 Document Reviewed: 01/18/2021 Elsevier Patient Education  [place]. Neuropathic Pain Neuropathic pain is pain caused by damage to the nerves that are responsible for certain sensations in your body (sensory nerves). Neuropathic pain can make you more sensitive to pain. Even a minor sensation can feel very painful. This is usually a long-term (chronic) condition that can be difficult to treat. The type of pain differs from person to person. It may: Start suddenly (acute), or it may develop slowly and become chronic. Come and go as damaged nerves heal, or it may stay at the same level for years. Cause emotional distress, loss of sleep, and a lower quality of life. What are the causes? The most common cause of this condition is diabetes. Many other diseases and conditions can also cause neuropathic pain. Causes of neuropathic pain can be classified as: Toxic. This is caused by medicines and chemicals. The most common causes of toxic neuropathic pain is damage from medicines that kill cancer cells (chemotherapy) or alcohol abuse. Metabolic. This can be caused by: Diabetes. Lack of vitamins like B12. Traumatic. Any injury that cuts, crushes, or stretches a nerve can cause damage and pain. Compression-related. If a sensory nerve gets trapped or compressed for a long period of time, the blood supply to the nerve can be cut off. Vascular. Many blood vessel diseases can cause neuropathic pain by decreasing blood supply and oxygen to nerves. Autoimmune. This type of pain results from diseases in which the body's defense system (immune system) mistakenly attacks sensory nerves. Examples of autoimmune diseases that can cause neuropathic pain include lupus  and multiple sclerosis. Infectious. Many types of viral infections can damage sensory nerves and cause pain. Shingles infection is a common cause of this type of pain. Inherited. Neuropathic pain can be a symptom of many diseases that are passed down through families (genetic). What increases the risk? You are more likely to develop this condition if: You have diabetes. You smoke. You drink too much alcohol. You are taking certain medicines, including chemotherapy or medicines that treat immune system disorders. What are the signs or symptoms? The main symptom is pain. Neuropathic pain is often described as: Burning. Shock-like. Stinging. Hot or cold. Itching. How is this diagnosed? No single test can diagnose neuropathic pain. It is diagnosed based on: A physical exam and your symptoms. Your health care provider will ask you about your pain. You may be asked to use a pain scale to describe how bad your pain is. Tests. These may be done to see if you have a cause and location of any nerve damage. They include: Nerve conduction studies and electromyography to test how well nerve signals travel through your nerves and muscles (electrodiagnostic testing). Skin biopsy to evaluate for small fiber neuropathy. Imaging studies, such as: X-rays. CT scan. MRI. How is this treated? Treatment for neuropathic pain may  change over time. You may need to try different treatment options or a combination of treatments. Some options include: Treating the underlying cause of the neuropathy, such as diabetes, kidney disease, or vitamin deficiencies. Stopping medicines that can cause neuropathy, such as chemotherapy. Medicine to relieve pain. Medicines may include: Prescription or over-the-counter pain medicine. Anti-seizure medicine. Antidepressant medicines. Pain-relieving patches or creams that are applied to painful areas of skin. A medicine to numb the area (local anesthetic), which can be injected  as a nerve block. Transcutaneous nerve stimulation. This uses electrical currents to block painful nerve signals. The treatment is painless. Alternative treatments, such as: Acupuncture. Meditation. Massage. Occupational or physical therapy. Pain management programs. Counseling. Follow these instructions at home: Medicines  Take over-the-counter and prescription medicines only as told by your health care provider. Ask your health care provider if the medicine prescribed to you: Requires you to avoid driving or using machinery. Can cause constipation. You may need to take these actions to prevent or treat constipation: Drink enough fluid to keep your urine pale yellow. Take over-the-counter or prescription medicines. Eat foods that are high in fiber, such as beans, whole grains, and fresh fruits and vegetables. Limit foods that are high in fat and processed sugars, such as fried or sweet foods. Lifestyle  Have a good support system at home. Consider joining a chronic pain support group. Do not use any products that contain nicotine or tobacco. These products include cigarettes, chewing tobacco, and vaping devices, such as e-cigarettes. If you need help quitting, ask your health care provider. Do not drink alcohol. General instructions Learn as much as you can about your condition. Work closely with all your health care providers to find the treatment plan that works best for you. Ask your health care provider what activities are safe for you. Keep all follow-up visits. This is important. Contact a health care provider if: Your pain treatments are not working. You are having side effects from your medicines. You are struggling with tiredness (fatigue), mood changes, depression, or anxiety. Get help right away if: You have thoughts of hurting yourself. Get help right away if you feel like you may hurt yourself or others, or have thoughts about taking your own life. Go to your  nearest emergency room or: Call 911. Call the [place] [facility] at [phone] or 988. This is open 24 hours a day. Text the Crisis Text Line at 512-354-7227. Summary Neuropathic pain is pain caused by damage to the nerves that are responsible for certain sensations in your body (sensory nerves). Neuropathic pain may come and go as damaged nerves heal, or it may stay at the same level for years. Neuropathic pain is usually a long-term condition that can be difficult to treat. Consider joining a chronic pain support group. This information is not intended to replace advice given to you by your health care provider. Make sure you discuss any questions you have with your health care provider. Document Revised: 05/28/2021 Document Reviewed: 05/28/2021 Elsevier Patient Education  [place].

## 2022-12-23 NOTE — Telephone Encounter (Signed)
Pt was ordered to get blood work completed from visit today. The [employer] technician came over to advise that as she was getting his labs today she started to get one ANA with reflex but wasn't able to get the remaining labs because he clotted off. She went to stick again and patient refused and became upset stating he didn't want to get these done today anyway. He was dehydrated as well. The lab tech printed the recs for the remaining blood work and they said they would try and go get the labs drawn some where else.

## 2022-12-25 LAB — ENA+DNA/DS+ANTICH+CENTRO+FA...
Anti JO-1: <0.2 AI (ref 0.0–0.9)
Antiribosomal P Antibodies: <0.2 AI (ref 0.0–0.9)
Centromere Ab Screen: <0.2 AI (ref 0.0–0.9)
Chromatin Ab SerPl-aCnc: <0.2 AI (ref 0.0–0.9)
ENA RNP Ab: <0.2 AI (ref 0.0–0.9)
ENA SM Ab Ser-aCnc: <0.2 AI (ref 0.0–0.9)
ENA SSA (RO) Ab: <0.2 AI (ref 0.0–0.9)
ENA SSB (LA) Ab: <0.2 AI (ref 0.0–0.9)
Homogeneous Pattern: 1:80 {titer}
Scleroderma (Scl-70) (ENA) Antibody, IgG: <0.2 AI (ref 0.0–0.9)
Smith/RNP Antibodies: <0.2 AI (ref 0.0–0.9)
Speckled Pattern: 1:80 {titer}
dsDNA Ab: <1 IU/mL (ref 0–9)

## 2022-12-25 LAB — ANA W/REFLEX: ANA Titer 1: POSITIVE — AB

## 2022-12-30 ENCOUNTER — Other Ambulatory Visit (HOSPITAL_COMMUNITY): Payer: Self-pay

## 2022-12-30 ENCOUNTER — Encounter (HOSPITAL_COMMUNITY): Payer: Self-pay | Admitting: Pharmacy Technician

## 2023-01-16 DIAGNOSIS — Z6841 Body Mass Index (BMI) 40.0 and over, adult: Secondary | ICD-10-CM | POA: Diagnosis not present

## 2023-01-16 DIAGNOSIS — R768 Other specified abnormal immunological findings in serum: Secondary | ICD-10-CM | POA: Diagnosis not present

## 2023-01-17 ENCOUNTER — Other Ambulatory Visit (HOSPITAL_COMMUNITY): Payer: Self-pay | Admitting: Internal Medicine

## 2023-02-13 ENCOUNTER — Ambulatory Visit (INDEPENDENT_AMBULATORY_CARE_PROVIDER_SITE_OTHER): Payer: BC Managed Care – PPO | Admitting: Neurology

## 2023-02-13 VITALS — BP 157/104 | HR 84 | Ht 65.0 in | Wt 250.0 lb

## 2023-02-13 DIAGNOSIS — I5041 Acute combined systolic (congestive) and diastolic (congestive) heart failure: Secondary | ICD-10-CM

## 2023-02-13 DIAGNOSIS — G8929 Other chronic pain: Secondary | ICD-10-CM

## 2023-02-13 DIAGNOSIS — M25562 Pain in left knee: Secondary | ICD-10-CM

## 2023-02-13 DIAGNOSIS — G629 Polyneuropathy, unspecified: Secondary | ICD-10-CM

## 2023-02-13 DIAGNOSIS — M5136 Other intervertebral disc degeneration, lumbar region: Secondary | ICD-10-CM

## 2023-02-13 DIAGNOSIS — M5416 Radiculopathy, lumbar region: Secondary | ICD-10-CM | POA: Diagnosis not present

## 2023-02-13 NOTE — Progress Notes (Signed)
Full Name: Michael Hester Gender: Male MRN #: [id number] Date of Birth: 1965/11/25    Visit Date: 02/13/2023 07:56 Age: 57 Years Examining Physician: Dr. [name] Referring Physician: Dr. Porfirio Mylar Dohmeier Height: 5 feet 5 inch    History: Left leg pain worse than right.  Patient reports back pain, sensory changes in the balls of the feet, the pain starts at the side or back of the leg and radiates down the left side of the lower leg.  His lower extremities are edematous left leg much more edematous than the right.  Summary: Nerve conductions were performed on the bilateral lower extremities.  EMG needle study was performed on the left lower extremity.  The left peroneal motor conduction showed decreased amplitude (1.6 mV, normal greater than 2). The left and right sural sensory nerve showed decreased amplitude (4 V, normal greater than 6) however this is likely technical due to edema in both lower extremities. All remaining nerves (as indicated in the following tables) were within normal limits.   All muscles (as indicated in the following tables) were within normal limits.    Conclusion:  - Conduction studies were only significant for slightly reduced amplitude of the left peroneal motor nerve.  The slight decrease in sural sensory amplitudes is likely technical in nature due to patient being quite edematous in his lower extremities.  Otherwise motor and sensory studies including superficial peroneal sensory conductions are normal.   - EMG needle study on the left lower extremity and lumbar paraspinals showed no acute/ongoing denervation or chronic neurogenic changes.   - The slightly abnormal left peroneal motor conduction, normal left superficial peroneal sensory conduction and clinical presentation of back pain with radiculopathy, is suggestive of lumbar radiculopathy. Without any changes on EMG needle study an exact location cannot be identified.  - However, MRI Lumbar spine  August 06 2023 does discuss multilevel degenerative disc disease with multilevel bilateral foraminal narrowing and, on my review of images, it does appear that L4-L5 with grade 1 anterior listhesis is more advanced in foraminal narrowing than the other levels which could affect the descending L5 nerve roots consistent with his symptoms. May consider repeat MRI lumbar spine as clinically warranted.     ------------------------------- [name], M.D.  [facility] [address] [place], [place] [address] Tel: [phone] Fax: [phone]  Verbal informed consent was obtained from the patient, patient was informed of potential risk of procedure, including bruising, bleeding, hematoma formation, infection, muscle weakness, muscle pain, numbness, among others.        MNC    Nerve / Sites Muscle Latency Ref. Amplitude Ref. Rel Amp Segments Distance Velocity Ref. Area    ms ms mV mV %  cm m/s m/s mVms  L Peroneal - EDB     Ankle EDB 5.0 ?6.5 1.6 ?2.0 100 Ankle - EDB 9   4.8     Fib head EDB 10.5  1.4  89.9 Fib head - Ankle 26 47 ?44 4.3     Pop fossa EDB 12.8  1.3  89.5 Pop fossa - Fib head 11.6 53 ?44 3.7         Pop fossa - Ankle      R Peroneal - EDB     Ankle EDB 5.1 ?6.5 2.2 ?2.0 100 Ankle - EDB 9   6.7     Fib head EDB 11.6  2.0  93.4 Fib head - Ankle 29 45 ?44 6.4  Pop fossa EDB 12.8  2.0  101 Pop fossa - Fib head 6 52 ?44 6.7         Pop fossa - Ankle      L Tibial - AH     Ankle AH 5.1 ?5.8 7.1 ?4.0 100 Ankle - AH 9   17.6     Pop fossa AH 14.3  5.7  80.3 Pop fossa - Ankle 41 45 ?41 14.5  L Peroneal - Tib Ant     Fib Head Tib Ant 6.0 ?4.7 3.1 ?3.0 100 Fib Head - Tib Ant 10   6.0     Pop fossa Tib Ant 9.6  1.4  46 Pop fossa - Fib Head 10 28 ?44 1.2             SNC    Nerve / Sites Rec. Site Peak Lat Ref.  Amp Ref. Segments Distance    ms ms V V  cm  L Sural - Ankle (Calf)     Calf Ankle 2.7 ?4.4 4 ?6 Calf - Ankle 14  R Sural - Ankle  (Calf)     Calf Ankle 2.6 ?4.4 4 ?6 Calf - Ankle 14  L Superficial peroneal - Ankle     Lat leg Ankle 3.0 ?4.4 6 ?6 Lat leg - Ankle 14  R Superficial peroneal - Ankle     Lat leg Ankle 4.3 ?4.4 6 ?6 Lat leg - Ankle 14             F  Wave    Nerve F Lat Ref.   ms ms  L Tibial - AH 50.7 ?56.0       EMG Summary Table    Spontaneous MUAP Recruitment  Muscle IA Fib PSW Fasc Other Amp Dur. Poly Pattern  L. Vastus medialis Normal None None None _______ Normal Normal Normal Normal  L.  Tibialis anterior Normal None None None _______ Normal Normal Normal Normal  L. Peroneus longus Normal None None None _______ Normal Normal Normal Normal  L. Gastrocnemius (Medial head) Normal None None None _______ Normal Normal Normal Normal  L. Extensor hallucis longus Normal None None None _______ Normal Normal Normal Normal  L. Gluteus maximus Normal None None None _______ Normal Normal Normal Normal  L. Gluteus medius Normal None None None _______ Normal Normal Normal Normal  L. Lumbar paraspinals (low) Normal None None None _______ Normal Normal Normal Normal  L. Biceps femoris (long head) Normal None None None _______ Normal Normal Normal Normal  L. Biceps femoris (short head) Normal None None None _______ Normal Normal Normal Normal

## 2023-02-16 ENCOUNTER — Other Ambulatory Visit: Payer: Self-pay | Admitting: Neurology

## 2023-02-16 NOTE — Procedures (Signed)
Full Name: Michael Hester Gender: Male MRN #: [id number] Date of Birth: 1965/11/25    Visit Date: 02/13/2023 07:56 Age: 57 Years Examining Physician: Dr. [name] Referring Physician: Dr. Porfirio Mylar Dohmeier Height: 5 feet 5 inch    History: Left leg pain worse than right.  Patient reports back pain, sensory changes in the balls of the feet, the pain starts at the side or back of the leg and radiates down the left side of the lower leg.  His lower extremities are edematous left leg much more edematous than the right.  Summary: Nerve conductions were performed on the bilateral lower extremities.  EMG needle study was performed on the left lower extremity.  The left peroneal motor conduction showed decreased amplitude (1.6 mV, normal greater than 2). The left and right sural sensory nerve showed decreased amplitude (4 V, normal greater than 6) however this is likely technical due to edema in both lower extremities. All remaining nerves (as indicated in the following tables) were within normal limits.   All muscles (as indicated in the following tables) were within normal limits.    Conclusion:  - Conduction studies were only significant for slightly reduced amplitude of the left peroneal motor nerve.  The slight decrease in sural sensory amplitudes is likely technical in nature due to patient being quite edematous in his lower extremities.  Otherwise motor and sensory studies including superficial peroneal sensory conductions are normal.   - EMG needle study on the left lower extremity and lumbar paraspinals showed no acute/ongoing denervation or chronic neurogenic changes.   - The slightly abnormal left peroneal motor conduction, normal left superficial peroneal sensory conduction and clinical presentation of back pain with radiculopathy, is suggestive of lumbar radiculopathy. Without any changes on EMG needle study an exact location cannot be identified.  - However, MRI Lumbar spine  August 06 2023 does discuss multilevel degenerative disc disease with multilevel bilateral foraminal narrowing and, on my review of images, it does appear that L4-L5 with grade 1 anterior listhesis is more advanced in foraminal narrowing than the other levels which could affect the descending L5 nerve roots consistent with his symptoms. May consider repeat MRI lumbar spine as clinically warranted.     ------------------------------- [name], M.D.  [facility] [address] [place], [place] [address] Tel: [phone] Fax: [phone]  Verbal informed consent was obtained from the patient, patient was informed of potential risk of procedure, including bruising, bleeding, hematoma formation, infection, muscle weakness, muscle pain, numbness, among others.        MNC    Nerve / Sites Muscle Latency Ref. Amplitude Ref. Rel Amp Segments Distance Velocity Ref. Area    ms ms mV mV %  cm m/s m/s mVms  L Peroneal - EDB     Ankle EDB 5.0 ?6.5 1.6 ?2.0 100 Ankle - EDB 9   4.8     Fib head EDB 10.5  1.4  89.9 Fib head - Ankle 26 47 ?44 4.3     Pop fossa EDB 12.8  1.3  89.5 Pop fossa - Fib head 11.6 53 ?44 3.7         Pop fossa - Ankle      R Peroneal - EDB     Ankle EDB 5.1 ?6.5 2.2 ?2.0 100 Ankle - EDB 9   6.7     Fib head EDB 11.6  2.0  93.4 Fib head - Ankle 29 45 ?44 6.4  Pop fossa EDB 12.8  2.0  101 Pop fossa - Fib head 6 52 ?44 6.7         Pop fossa - Ankle      L Tibial - AH     Ankle AH 5.1 ?5.8 7.1 ?4.0 100 Ankle - AH 9   17.6     Pop fossa AH 14.3  5.7  80.3 Pop fossa - Ankle 41 45 ?41 14.5  L Peroneal - Tib Ant     Fib Head Tib Ant 6.0 ?4.7 3.1 ?3.0 100 Fib Head - Tib Ant 10   6.0     Pop fossa Tib Ant 9.6  1.4  46 Pop fossa - Fib Head 10 28 ?44 1.2             SNC    Nerve / Sites Rec. Site Peak Lat Ref.  Amp Ref. Segments Distance    ms ms V V  cm  L Sural - Ankle (Calf)     Calf Ankle 2.7 ?4.4 4 ?6 Calf - Ankle 14  R Sural - Ankle  (Calf)     Calf Ankle 2.6 ?4.4 4 ?6 Calf - Ankle 14  L Superficial peroneal - Ankle     Lat leg Ankle 3.0 ?4.4 6 ?6 Lat leg - Ankle 14  R Superficial peroneal - Ankle     Lat leg Ankle 4.3 ?4.4 6 ?6 Lat leg - Ankle 14             F  Wave    Nerve F Lat Ref.   ms ms  L Tibial - AH 50.7 ?56.0       EMG Summary Table    Spontaneous MUAP Recruitment  Muscle IA Fib PSW Fasc Other Amp Dur. Poly Pattern  L. Vastus medialis Normal None None None _______ Normal Normal Normal Normal  L.  Tibialis anterior Normal None None None _______ Normal Normal Normal Normal  L. Peroneus longus Normal None None None _______ Normal Normal Normal Normal  L. Gastrocnemius (Medial head) Normal None None None _______ Normal Normal Normal Normal  L. Extensor hallucis longus Normal None None None _______ Normal Normal Normal Normal  L. Gluteus maximus Normal None None None _______ Normal Normal Normal Normal  L. Gluteus medius Normal None None None _______ Normal Normal Normal Normal  L. Lumbar paraspinals (low) Normal None None None _______ Normal Normal Normal Normal  L. Biceps femoris (long head) Normal None None None _______ Normal Normal Normal Normal  L. Biceps femoris (short head) Normal None None None _______ Normal Normal Normal Normal

## 2023-02-24 ENCOUNTER — Other Ambulatory Visit (HOSPITAL_COMMUNITY): Payer: Self-pay | Admitting: Internal Medicine

## 2023-02-25 ENCOUNTER — Encounter: Payer: Self-pay | Admitting: Neurology

## 2023-02-26 DIAGNOSIS — M5136 Other intervertebral disc degeneration, lumbar region: Secondary | ICD-10-CM | POA: Diagnosis not present

## 2023-02-26 DIAGNOSIS — H6993 Unspecified Eustachian tube disorder, bilateral: Secondary | ICD-10-CM | POA: Diagnosis not present

## 2023-02-26 DIAGNOSIS — I5082 Biventricular heart failure: Secondary | ICD-10-CM | POA: Diagnosis not present

## 2023-02-26 DIAGNOSIS — Z6841 Body Mass Index (BMI) 40.0 and over, adult: Secondary | ICD-10-CM | POA: Diagnosis not present

## 2023-03-03 ENCOUNTER — Other Ambulatory Visit (HOSPITAL_COMMUNITY): Payer: Self-pay

## 2023-03-03 DIAGNOSIS — I5022 Chronic systolic (congestive) heart failure: Secondary | ICD-10-CM

## 2023-03-03 DIAGNOSIS — I2510 Atherosclerotic heart disease of native coronary artery without angina pectoris: Secondary | ICD-10-CM

## 2023-03-17 DIAGNOSIS — Z6841 Body Mass Index (BMI) 40.0 and over, adult: Secondary | ICD-10-CM | POA: Diagnosis not present

## 2023-03-17 DIAGNOSIS — R202 Paresthesia of skin: Secondary | ICD-10-CM | POA: Diagnosis not present

## 2023-03-17 DIAGNOSIS — M25562 Pain in left knee: Secondary | ICD-10-CM | POA: Diagnosis not present

## 2023-03-24 ENCOUNTER — Encounter (HOSPITAL_COMMUNITY): Payer: Self-pay | Admitting: Pharmacy Technician

## 2023-03-24 MED ORDER — ENTRESTO 97-103 MG PO TABS: 1.0000 | ORAL_TABLET | Freq: Two times a day (BID) | ORAL | 2 refills | Status: DC

## 2023-03-26 ENCOUNTER — Telehealth: Payer: Self-pay | Admitting: *Deleted

## 2023-03-26 NOTE — Telephone Encounter (Signed)
Received MRI lumbar spine report and CD. They have been placed on Dr Trevor Mace desk for review.

## 2023-04-07 NOTE — Telephone Encounter (Signed)
We have the disk and report. We can send this information and the emg/ncs to neurosurgery and see what they think. The rpeport does show multi-level foraminal narrowing and may ne the cause of his left leg pain. I'd like a neurosurgeon, or if he has a relationship with an orthopaedist or neurosyrgeon, to take a look. Let me know and I can refer

## 2023-04-07 NOTE — Telephone Encounter (Signed)
LMVM for pt to return call about MRI lumbar spine results.

## 2023-04-08 ENCOUNTER — Encounter: Payer: Self-pay | Admitting: Neurology

## 2023-04-08 ENCOUNTER — Ambulatory Visit (INDEPENDENT_AMBULATORY_CARE_PROVIDER_SITE_OTHER): Payer: BC Managed Care – PPO | Admitting: Neurology

## 2023-04-08 VITALS — BP 154/92 | HR 89 | Ht 65.0 in | Wt 267.0 lb

## 2023-04-08 DIAGNOSIS — M5136 Other intervertebral disc degeneration, lumbar region: Secondary | ICD-10-CM

## 2023-04-08 DIAGNOSIS — G629 Polyneuropathy, unspecified: Secondary | ICD-10-CM | POA: Diagnosis not present

## 2023-04-08 NOTE — Patient Instructions (Signed)
Degenerative Disk Disease  Degenerative disk disease is a condition caused by changes that occur in the spinal disks as a person ages. Spinal disks are soft and compressible disks located between the bones of your spine (vertebrae). These disks act like shock absorbers. Degenerative disk disease can affect the whole spine. However, the neck and lower back are most often affected. Many changes can occur in the spinal disks with aging, such as: The spinal disks may dry and shrink. Small tears may occur in the tough, outer covering of the disk (annulus). The disk space may become smaller due to loss of water. Abnormal growths in the bone (spurs) may occur. This can put pressure on the nerve roots exiting the spinal canal, causing pain. The spinal canal may become narrowed. What are the causes? This condition may be caused by: Normal degeneration with age. Injuries. Certain activities and sports that cause damage. What increases the risk? The following factors may make you more likely to develop this condition: Being overweight. Having a family history of degenerative disk disease. Smoking and use of products that contain nicotine and tobacco. Sudden injury. Doing work that requires heavy lifting. What are the signs or symptoms? Symptoms of this condition include: Pain that varies in intensity. Some people have no pain, while others have severe pain. The location of the pain depends on the part of your backbone that is affected. You may have: Pain in your neck or arm if a disk in your neck area is affected. Pain in your back, buttocks, or legs if a disk in your lower back is affected. Pain that becomes worse while bending or reaching up, or with twisting movements. Pain that may start gradually and worsen as time passes. It may also start after a major or minor injury. Numbness or tingling in the arms or legs. How is this diagnosed? This condition may be diagnosed based on: Your symptoms  and medical history. A physical exam. Imaging tests, including: X-ray of the spine. CT scan. MRI. How is this treated? This condition may be treated with: Medicines. Injection of steroids into the back. Rehabilitation exercises. These activities aim to strengthen muscles in your back and abdomen to better support your spine. If treatments do not help to relieve your symptoms or you have severe pain, you may need surgery. Follow these instructions at home: Medicines Take over-the-counter and prescription medicines only as told by your health care provider. Ask your health care provider if the medicine prescribed to you: Requires you to avoid driving or using machinery. Can cause constipation. You may need to take these actions to prevent or treat constipation: Drink enough fluid to keep your urine pale yellow. Take over-the-counter or prescription medicines. Eat foods that are high in fiber, such as beans, whole grains, and fresh fruits and vegetables. Limit foods that are high in fat and processed sugars, such as fried or sweet foods. Activity Rest as told by your health care provider. Avoid sitting for a long time without moving. Get up to take short walks every 1-2 hours. This is important to improve blood flow and breathing. Ask for help if you feel weak or unsteady. Return to your normal activities as told by your health care provider. Ask your health care provider what activities are safe for you. Perform relaxation exercises as told by your health care provider. Maintain good posture. Do not lift anything that is heavier than 10 lb (4.5 kg), or the limit that you are told, until your health   care provider says that it is safe. Follow proper lifting and walking techniques as told by your health care provider. Managing pain, stiffness, and swelling     If directed, put ice on the painful area. Icing can help to relieve pain. To do this: Put ice in a plastic bag. Place a towel  between your skin and the bag. Leave the ice on for 20 minutes, 2-3 times a day. Remove the ice if your skin turns bright red. This is very important. If you cannot feel pain, heat, or cold, you have a greater risk of damage to the area. If directed, apply heat to the painful area as often as told by your health care provider. Heat can reduce the stiffness of your muscles. Use the heat source that your health care provider recommends, such as a moist heat pack or a heating pad. Place a towel between your skin and the heat source. Leave the heat on for 20-30 minutes. Remove the heat if your skin turns bright red. This is especially important if you are unable to feel pain, heat, or cold. You may have a greater risk of getting burned. General instructions Change your sitting, standing, and sleeping habits as told by your health care provider. Avoid sitting in the same position for long periods of time. Change positions frequently. Lose weight or maintain a healthy weight as told by your health care provider. Do not use any products that contain nicotine or tobacco, such as cigarettes, e-cigarettes, and chewing tobacco. If you need help quitting, ask your health care provider. Wear supportive footwear. Keep all follow-up visits. This is important. This may include visits for physical therapy. Contact a health care provider if you: Have pain that does not go away within 1-4 weeks. Lose your appetite. Lose weight without trying. Get help right away if you: Have severe pain. Notice weakness in your arms, hands, or legs. Begin to lose control of your bladder or bowel movements. Have fevers or night sweats. Summary Degenerative disk disease is a condition caused by changes that occur in the spinal disks as a person ages. This condition can affect the whole spine. However, the neck and lower back are most often affected. Take over-the-counter and prescription medicines only as told by your health  care provider. This information is not intended to replace advice given to you by your health care provider. Make sure you discuss any questions you have with your health care provider. Document Revised: 01/13/2020 Document Reviewed: 01/13/2020 Elsevier Patient Education  2024 Elsevier Inc.  
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
## 2023-04-08 NOTE — Telephone Encounter (Signed)
Pt saw Dr/ Dohmeier today.  Gave him results of EMG/[place].

## 2023-04-10 ENCOUNTER — Other Ambulatory Visit (HOSPITAL_COMMUNITY): Payer: Self-pay | Admitting: Cardiology

## 2023-04-10 MED ORDER — SPIRONOLACTONE 25 MG PO TABS: 25.0000 mg | ORAL_TABLET | Freq: Every day | ORAL | 0 refills | Status: DC

## 2023-05-01 ENCOUNTER — Ambulatory Visit (HOSPITAL_BASED_OUTPATIENT_CLINIC_OR_DEPARTMENT_OTHER)
Admission: RE | Admit: 2023-05-01 | Discharge: 2023-05-01 | Disposition: A | Discharge status: Home / Self Care | Payer: BC Managed Care – PPO | Source: Ambulatory Visit | Attending: Internal Medicine | Admitting: Internal Medicine

## 2023-05-01 ENCOUNTER — Ambulatory Visit (HOSPITAL_COMMUNITY)
Admission: RE | Admit: 2023-05-01 | Discharge: 2023-05-01 | Disposition: A | Discharge status: Home / Self Care | Payer: BC Managed Care – PPO | Source: Ambulatory Visit | Attending: Internal Medicine | Admitting: Internal Medicine

## 2023-05-01 VITALS — BP 152/102 | HR 99 | Wt 269.4 lb

## 2023-05-01 DIAGNOSIS — M549 Dorsalgia, unspecified: Secondary | ICD-10-CM | POA: Diagnosis not present

## 2023-05-01 DIAGNOSIS — Z6841 Body Mass Index (BMI) 40.0 and over, adult: Secondary | ICD-10-CM | POA: Insufficient documentation

## 2023-05-01 DIAGNOSIS — G4733 Obstructive sleep apnea (adult) (pediatric): Secondary | ICD-10-CM

## 2023-05-01 DIAGNOSIS — I428 Other cardiomyopathies: Secondary | ICD-10-CM | POA: Diagnosis not present

## 2023-05-01 DIAGNOSIS — I5022 Chronic systolic (congestive) heart failure: Secondary | ICD-10-CM | POA: Diagnosis not present

## 2023-05-01 DIAGNOSIS — I10 Essential (primary) hypertension: Secondary | ICD-10-CM

## 2023-05-01 DIAGNOSIS — M25569 Pain in unspecified knee: Secondary | ICD-10-CM | POA: Diagnosis not present

## 2023-05-01 DIAGNOSIS — Z79899 Other long term (current) drug therapy: Secondary | ICD-10-CM | POA: Insufficient documentation

## 2023-05-01 DIAGNOSIS — E669 Obesity, unspecified: Secondary | ICD-10-CM | POA: Insufficient documentation

## 2023-05-01 DIAGNOSIS — I110 Hypertensive heart disease with heart failure: Secondary | ICD-10-CM | POA: Insufficient documentation

## 2023-05-01 DIAGNOSIS — I2510 Atherosclerotic heart disease of native coronary artery without angina pectoris: Secondary | ICD-10-CM | POA: Diagnosis not present

## 2023-05-01 LAB — ECHOCARDIOGRAM COMPLETE
AR max vel: 2.86 cm2
AV Area VTI: 3.16 cm2
AV Area mean vel: 2.63 cm2
AV Mean grad: 5 mmHg
AV Peak grad: 8.3 mmHg
Ao pk vel: 1.44 m/s
Area-P 1/2: 5.16 cm2
S' Lateral: 3.2 cm

## 2023-05-01 MED ORDER — CARVEDILOL 6.25 MG PO TABS: 6.2500 mg | ORAL_TABLET | Freq: Two times a day (BID) | ORAL | 3 refills | Status: DC

## 2023-05-01 MED ORDER — SPIRONOLACTONE 25 MG PO TABS: 25.0000 mg | ORAL_TABLET | Freq: Every day | ORAL | 3 refills | Status: DC

## 2023-05-01 MED ORDER — ENTRESTO 97-103 MG PO TABS: 1.0000 | ORAL_TABLET | Freq: Two times a day (BID) | ORAL | 11 refills | Status: DC

## 2023-05-01 NOTE — Patient Instructions (Signed)
Medication Changes:  MEDICATIONS REFILLED   Follow-Up in: 24 MONTHS WITH ECHO RIGHT BEFORE- PLEASE CALL OUR OFFICE [place] MAY OF 2026  At the [facility], you and your health needs are our priority. We have a designated team specialized in the treatment of Heart Failure. This Care Team includes your primary Heart Failure Specialized Cardiologist (physician), Advanced Practice Providers (APPs- Physician Assistants and Nurse Practitioners), and Pharmacist who all work together to provide you with the care you need, when you need it.   You may see any of the following providers on your designated Care Team at your next follow up:  Dr. Arvilla Meres Dr. Marca Ancona Dr. Marcos Eke, NP Robbie Lis, [place] [facility] [place], [place] Brynda Peon, NP Karle Plumber, PharmD   Please be sure to bring in all your medications bottles to every appointment.   Need to Contact us:  If you have any questions or concerns before your next appointment please send [place] a message through [place] or call our office at [phone].    TO LEAVE A MESSAGE FOR THE NURSE SELECT OPTION 2, PLEASE LEAVE A MESSAGE INCLUDING: YOUR NAME DATE OF BIRTH CALL BACK NUMBER REASON FOR CALL**this is important as we prioritize the call backs  YOU WILL RECEIVE A CALL BACK THE SAME DAY AS LONG AS YOU CALL BEFORE 4:00 PM

## 2023-05-01 NOTE — Addendum Note (Signed)
Encounter addended by: Baird Cancer, RN on: 05/01/2023 3:20 PM  Actions taken: Order list changed, Clinical Note Signed
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
## 2023-06-17 IMAGING — CT CT ANGIO CHEST
2 of 6 series · 18 of 46 positions shown · IV contrast (Omnipaque or Isovue)
Comparison: Previous studies including the examination done earlier
today

CLINICAL DATA: Shortness of breath

EXAM:
CT ANGIOGRAPHY CHEST WITH CONTRAST
TECHNIQUE: Multidetector CT imaging of the chest was performed using the
standard protocol during bolus administration of intravenous
contrast. Multiplanar CT image reconstructions and MIPs were
obtained to evaluate the vascular anatomy.
CONTRAST:  100mL OMNIPAQUE IOHEXOL 350 MG/ML SOLN

[Series 5: pe axial thins · axial · 0.88mm/px · z∈[+1257,+1574]mm · 15 of 432 slices shown]
[im 18/432  lung]
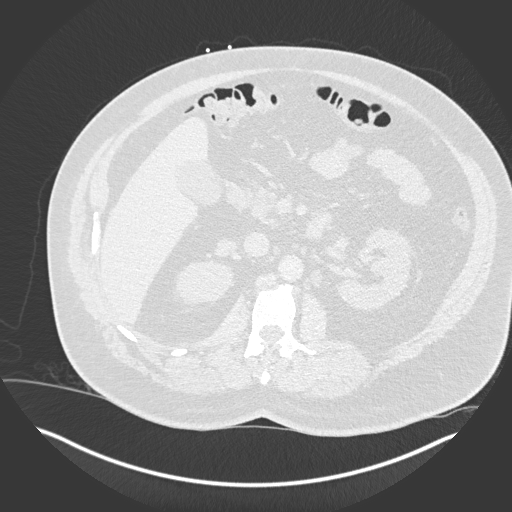
[im 54/432  soft-tissue]
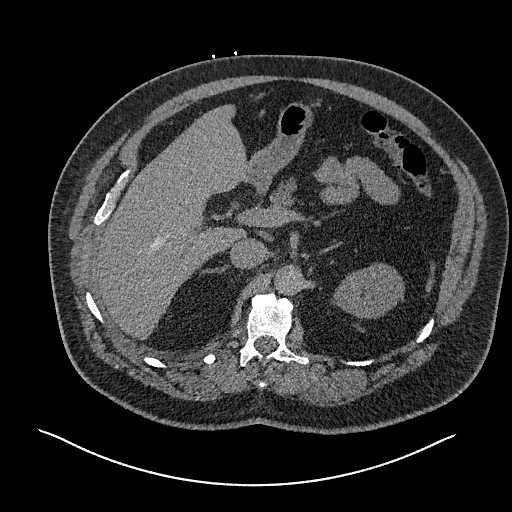
[im 72/432  lung]
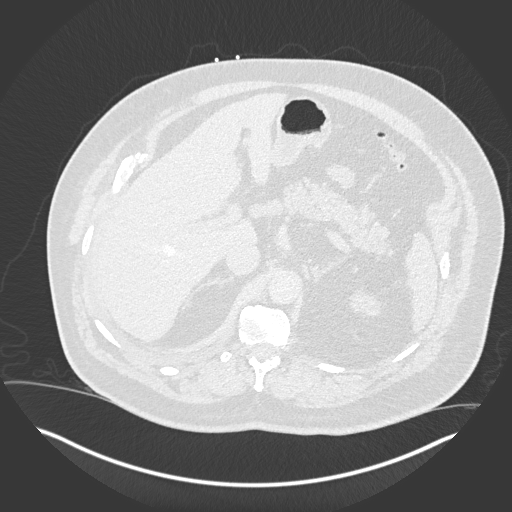
[im 108/432  soft-tissue]
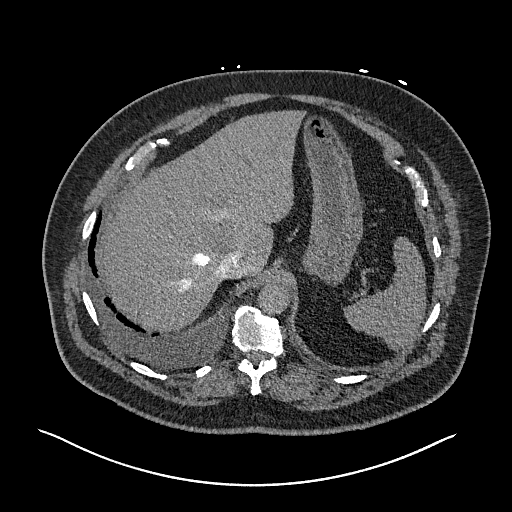
[im 126/432  lung]
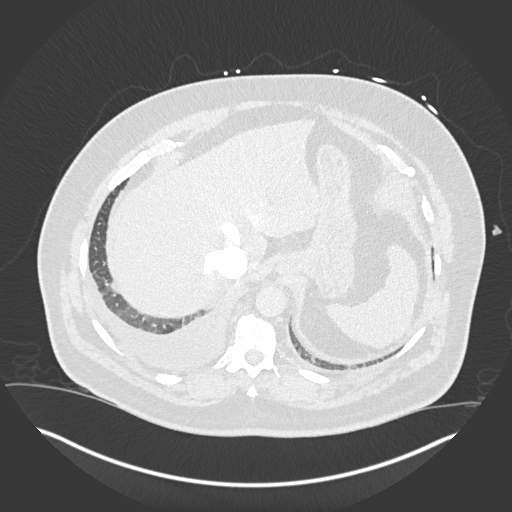
[im 162/432  soft-tissue]
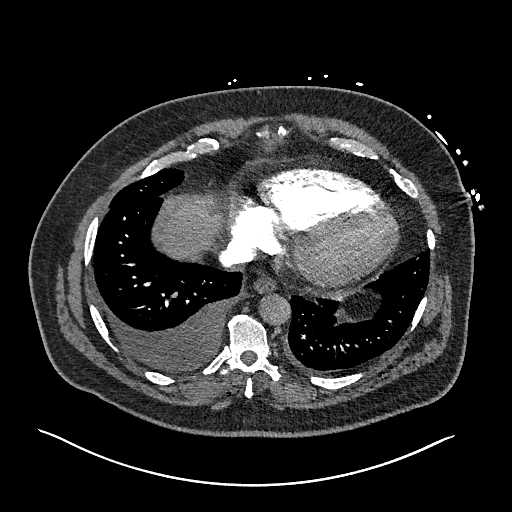
[im 180/432  lung]
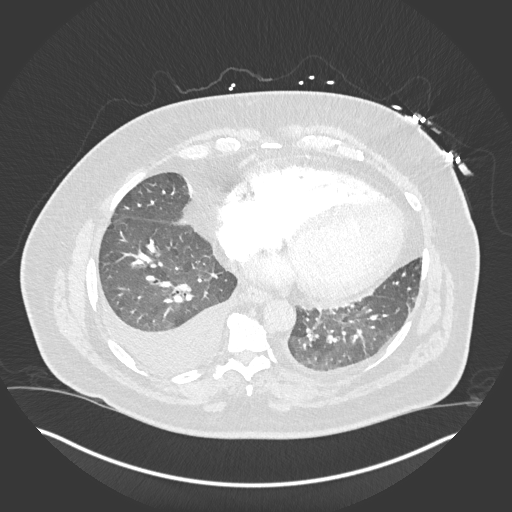
[im 216/432  soft-tissue]
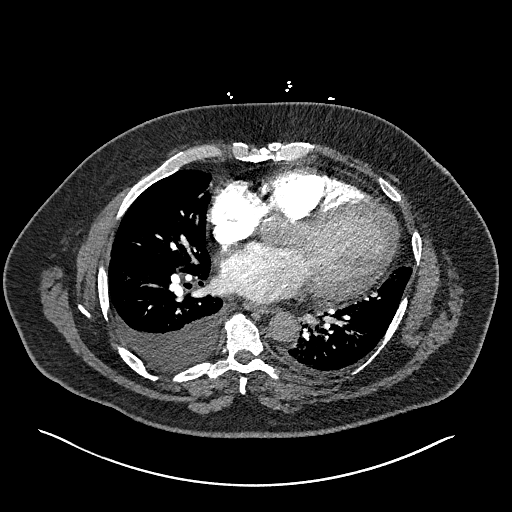
[im 252/432  lung]
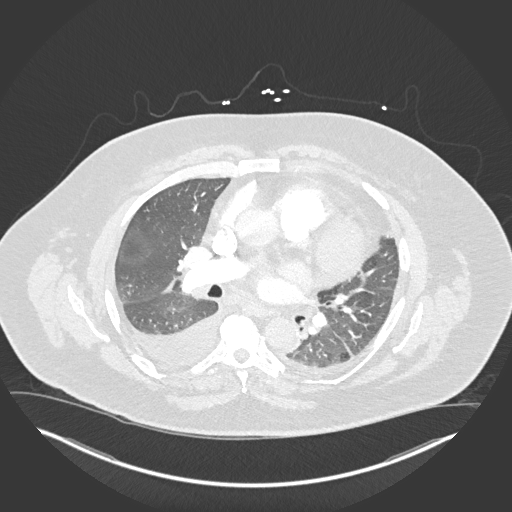
[im 270/432  soft-tissue]
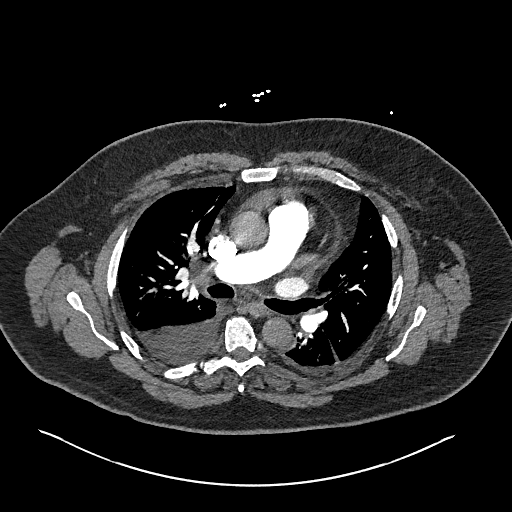
[im 306/432  lung]
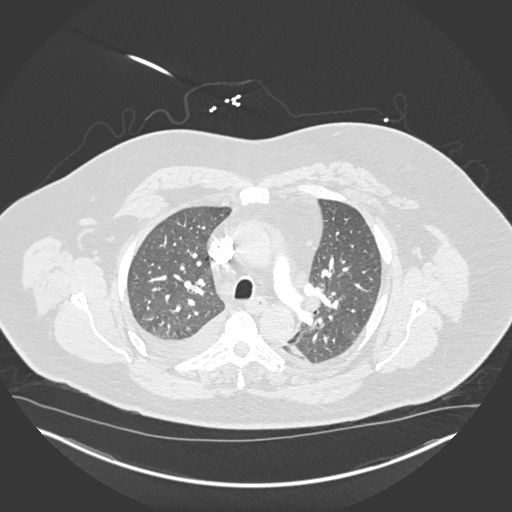
[im 324/432  soft-tissue]
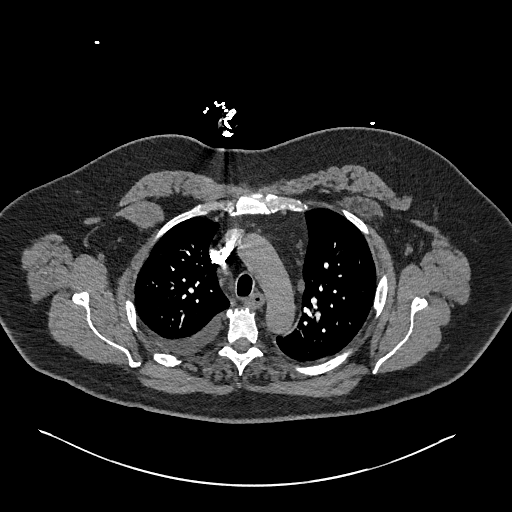
[im 360/432  lung]
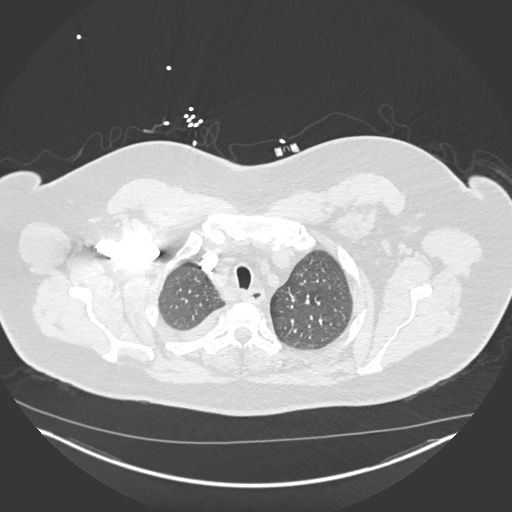
[im 378/432  soft-tissue]
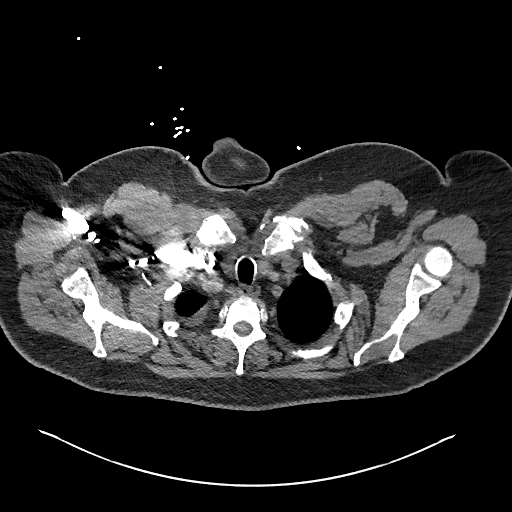
[im 414/432  lung]
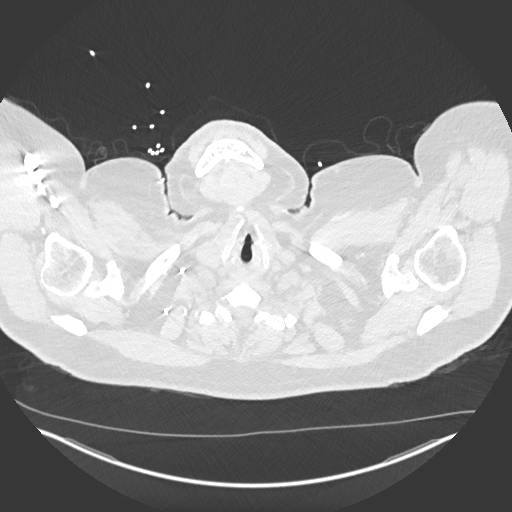

[Series 7: cor soft · coronal · 0.73mm/px · 3 of 177 slices shown]
[im 45/177  soft-tissue]
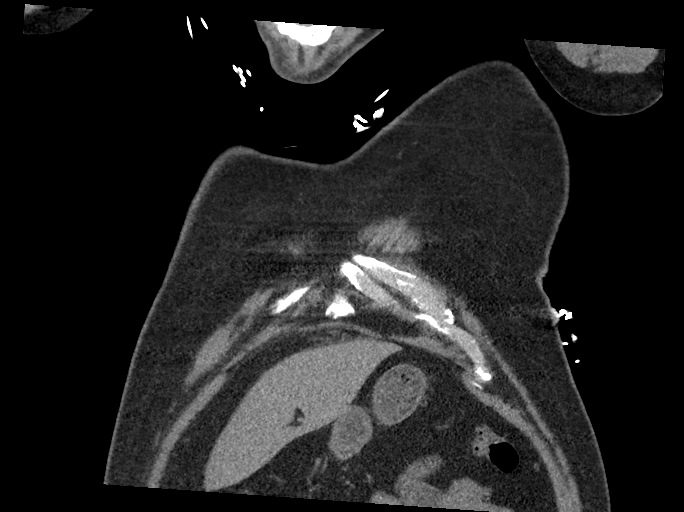
[im 89/177  soft-tissue]
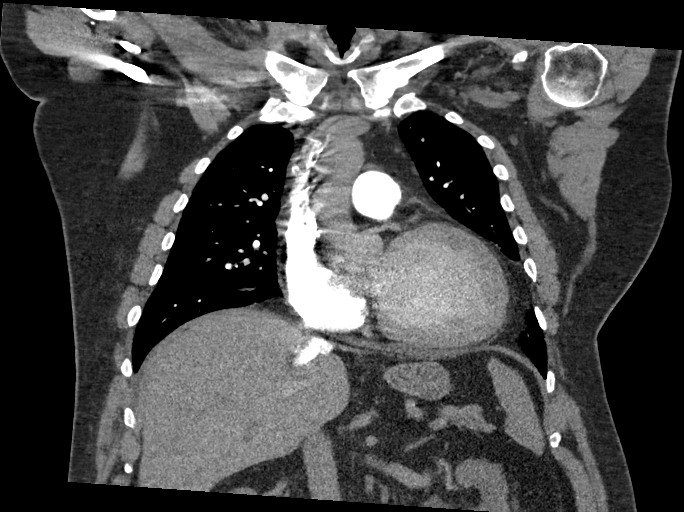
[im 133/177  soft-tissue]
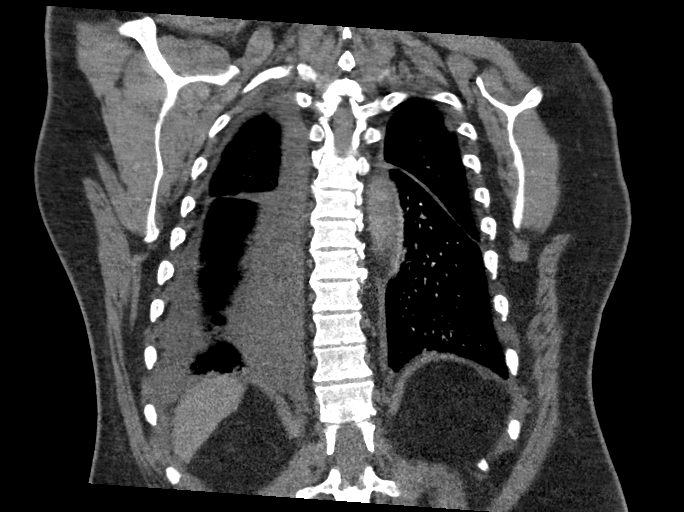

[18 of 46 positions shown; findings below may reference images not displayed]

FINDINGS: Cardiovascular: Heart is enlarged in size. There is reflux of
contrast into hepatic veins suggesting tricuspid incompetence.
Contrast density in thoracic aorta is less than adequate to evaluate
the lumen. There are no intraluminal filling defects in the central
pulmonary artery branches. There is ectasia of main pulmonary artery
measuring 3.8 cm in diameter suggesting possible pulmonary arterial
hypertension.

Mediastinum/Nodes: There are subcentimeter nodes in mediastinum.

Lungs/Pleura: There are subtle ground-glass densities in the
parahilar regions. In image 106 of series 6, there is 12 mm
noncalcified nodule in the right lower lobe.

Upper Abdomen: Gallbladder stones are seen.

Musculoskeletal: Unremarkable.

Review of the MIP images confirms the above findings.
IMPRESSION: There is no evidence of pulmonary artery embolism. Contrast density
in the thoracic aorta is less than adequate to evaluate the lumen.
There are ground-glass densities in the parahilar regions, possibly
suggesting mild interstitial edema or interstitial pneumonitis.
Small to moderate bilateral pleural effusions, more so on the right
side.

There is ectasia of main pulmonary artery suggesting pulmonary
arterial hypertension. There is reflux of contrast into hepatic
veins suggesting tricuspid incompetence.

There is 12 mm noncalcified nodule in the right lower lobe. This may
suggest granuloma or malignant neoplasm. Short-term follow-up CT and
PET-CT if warranted may be considered.

Gallbladder stones.

## 2023-06-17 IMAGING — DX DG CHEST 2V
2 series · 2 of 2 positions shown · non-contrast
Comparison: 07/29/2016

CLINICAL DATA: Short of breath

EXAM:
CHEST - 2 VIEW

[chest pa]
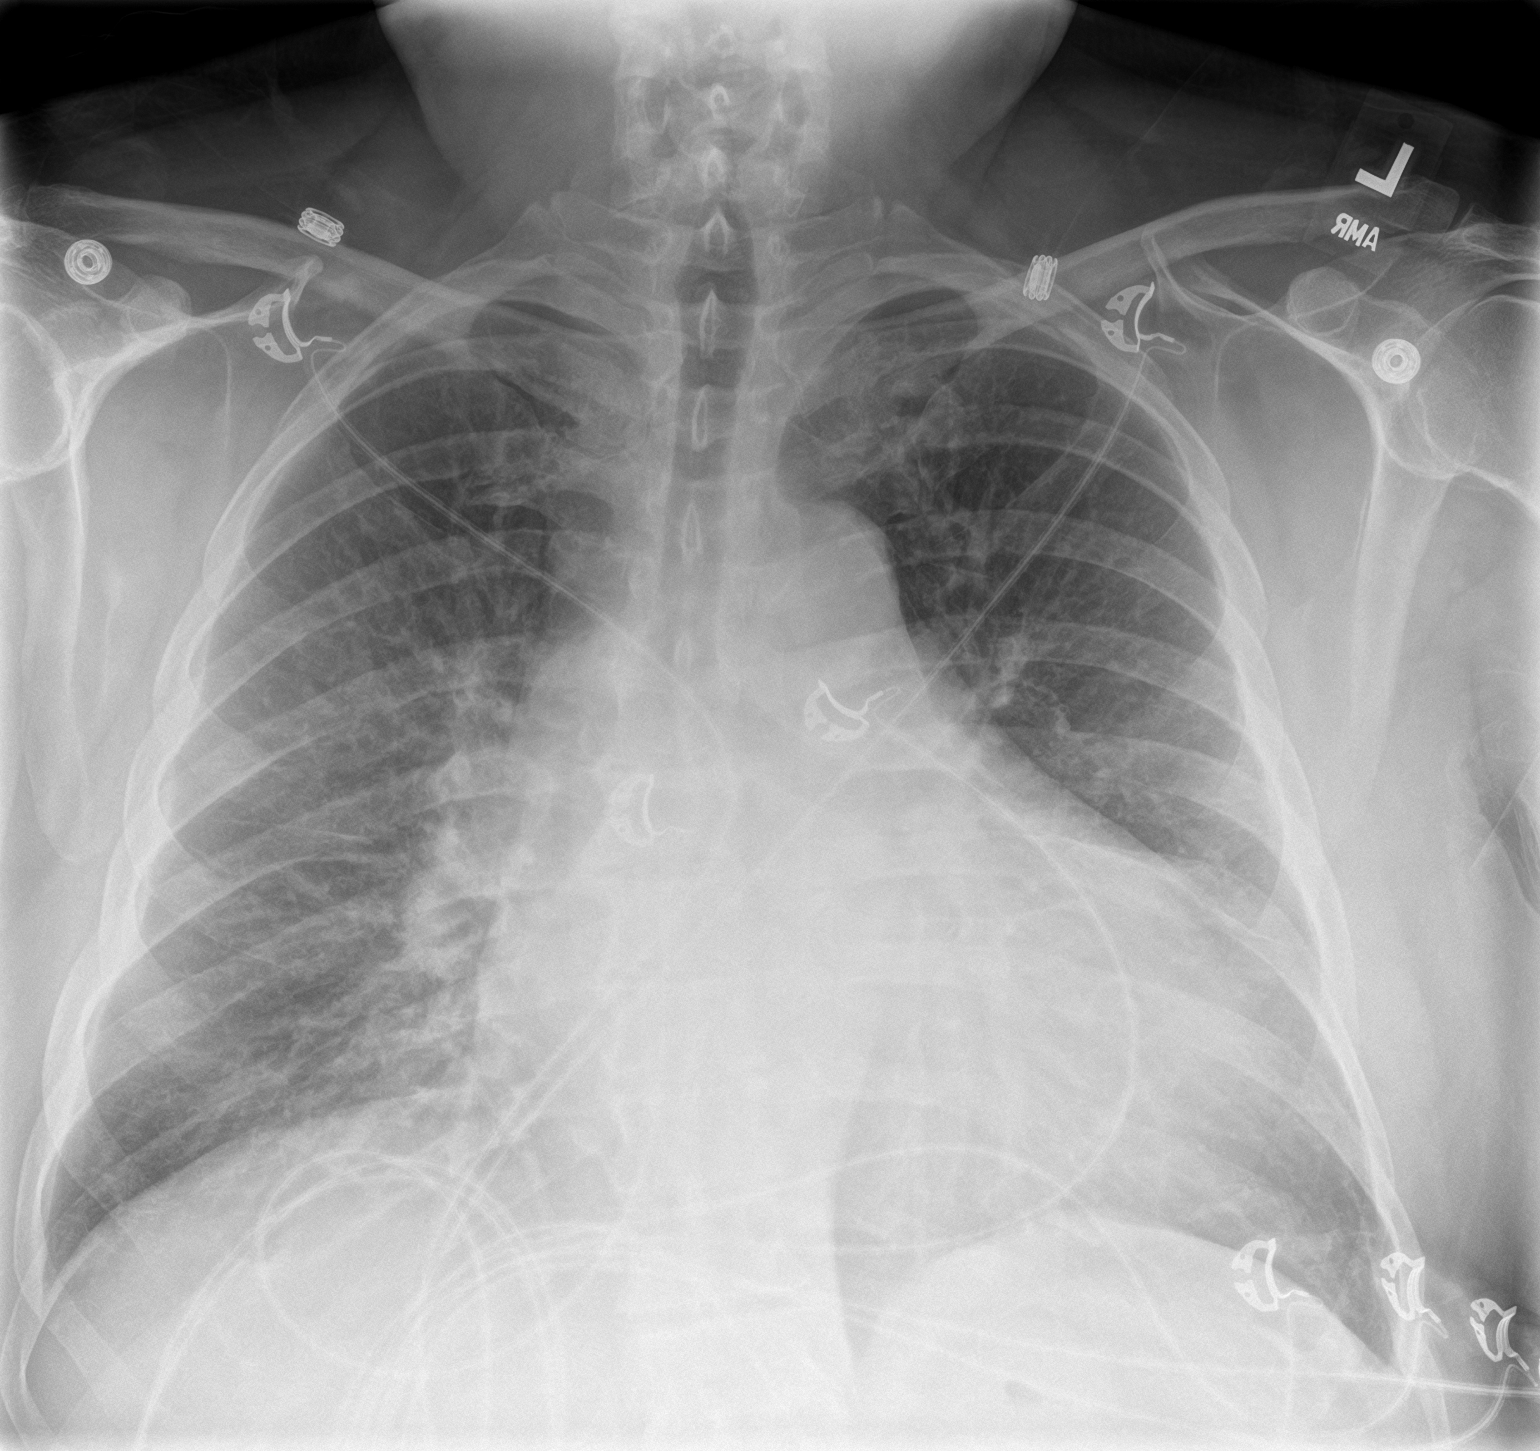

[chest lat]
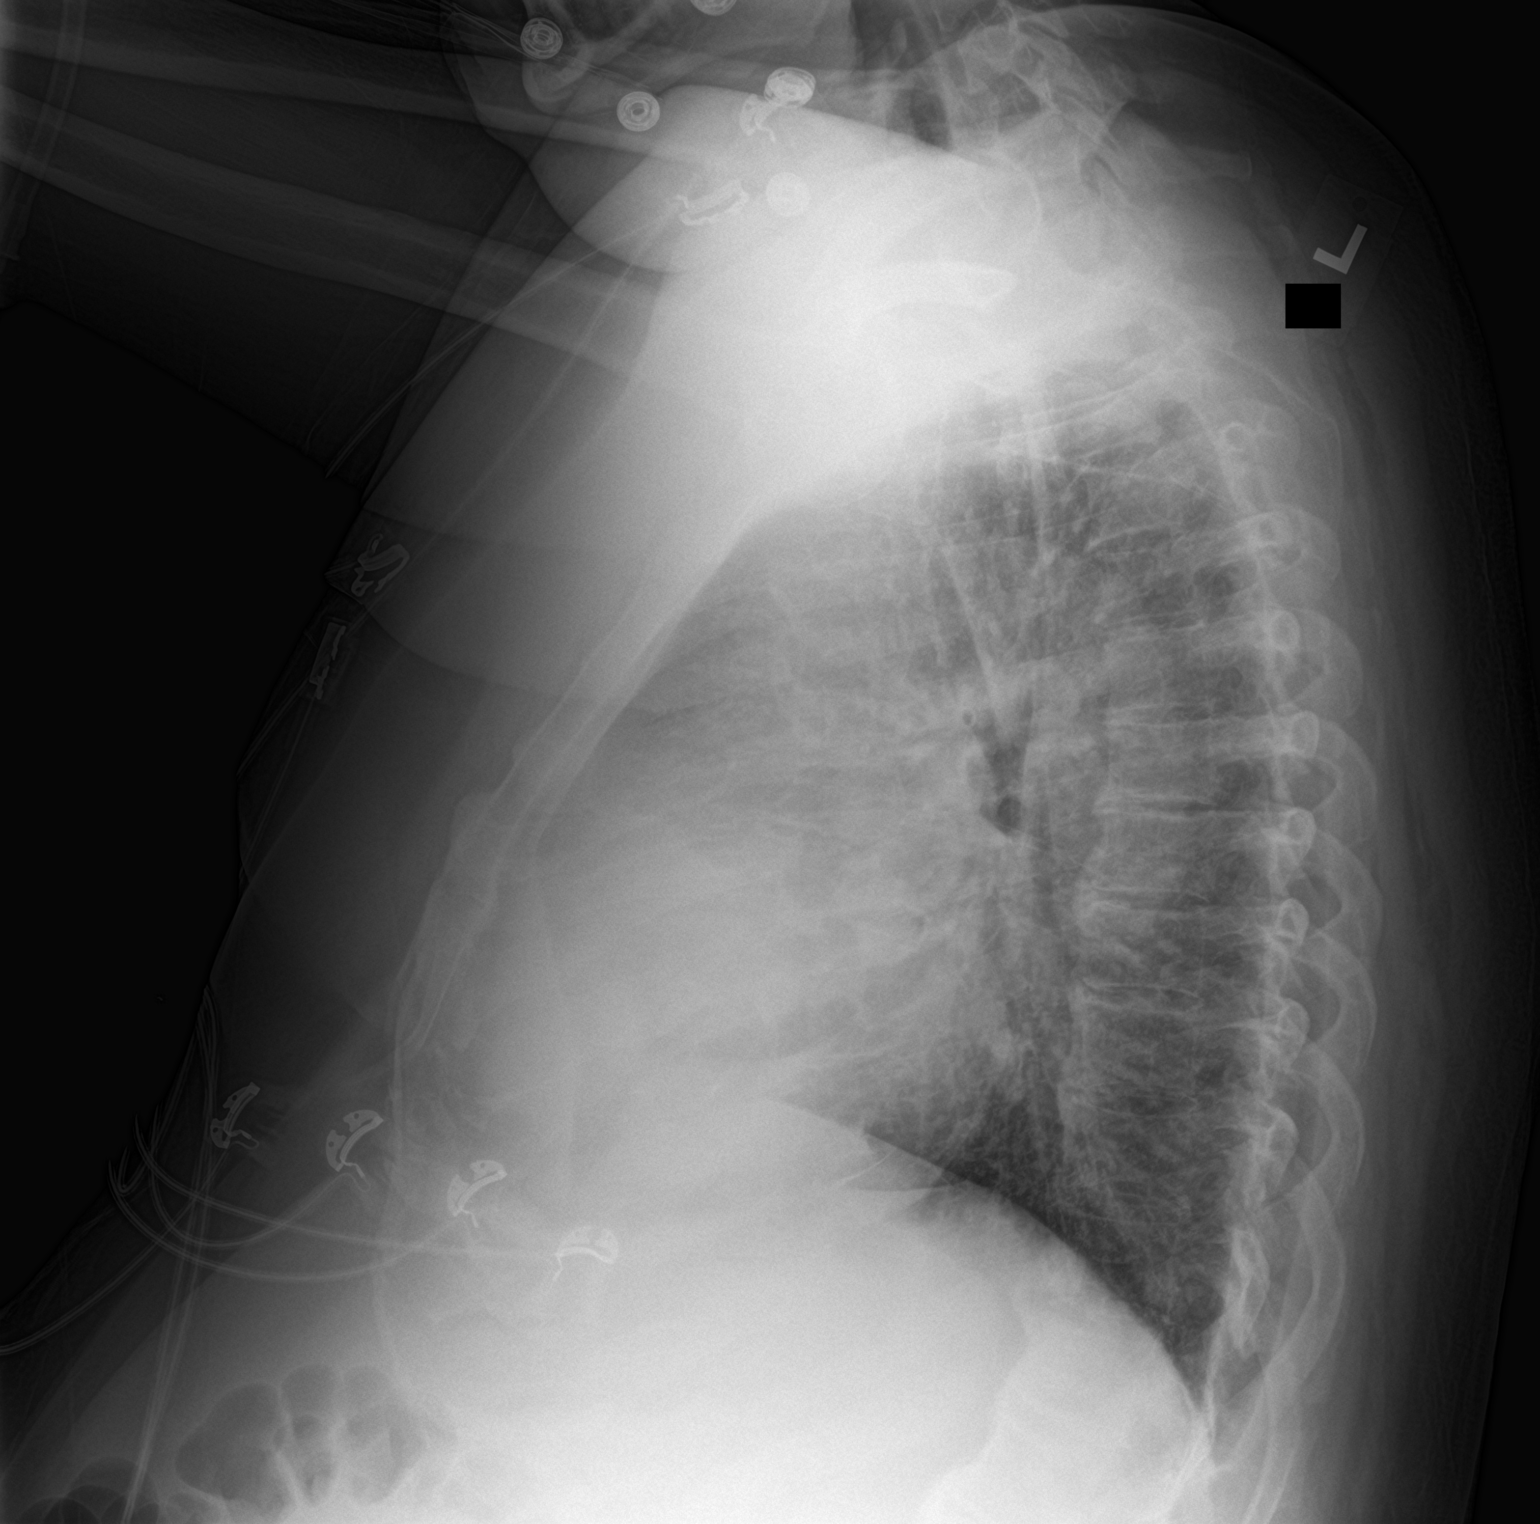

[2 of 2 positions shown; findings below may reference images not displayed]

FINDINGS: Cardiac enlargement which has progressed in the interval. Vascular
congestion with mild interstitial edema. No pleural effusion. No
focal infiltrate.
IMPRESSION: Cardiac enlargement with vascular congestion and mild interstitial
edema compatible with heart failure.

## 2023-06-20 IMAGING — US US CHEST/MEDIASTINUM
1 series · 3 of 3 positions shown · non-contrast
Comparison: CT angio chest 09/21/2021

CLINICAL DATA: RIGHT pleural effusion

EXAM:
CHEST ULTRASOUND

[Series 1: us thoracentesis asp pleural space w/img guide · 3 of 3 slices shown]
[im 1/3]
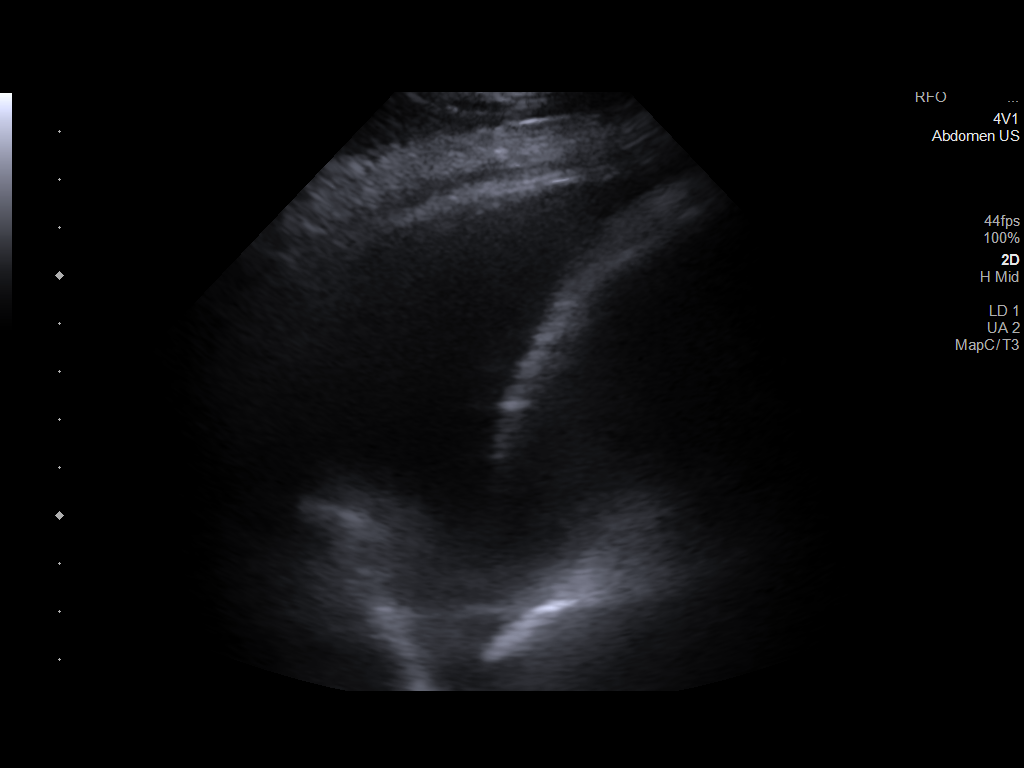
[im 2/3]
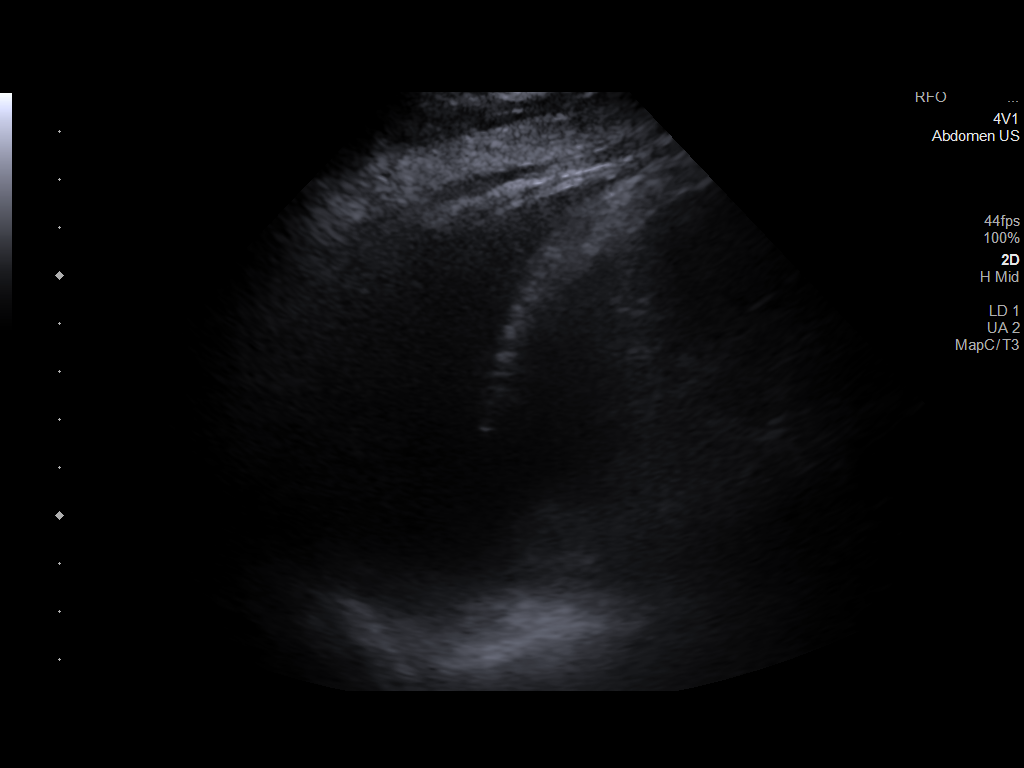
[im 3/3]
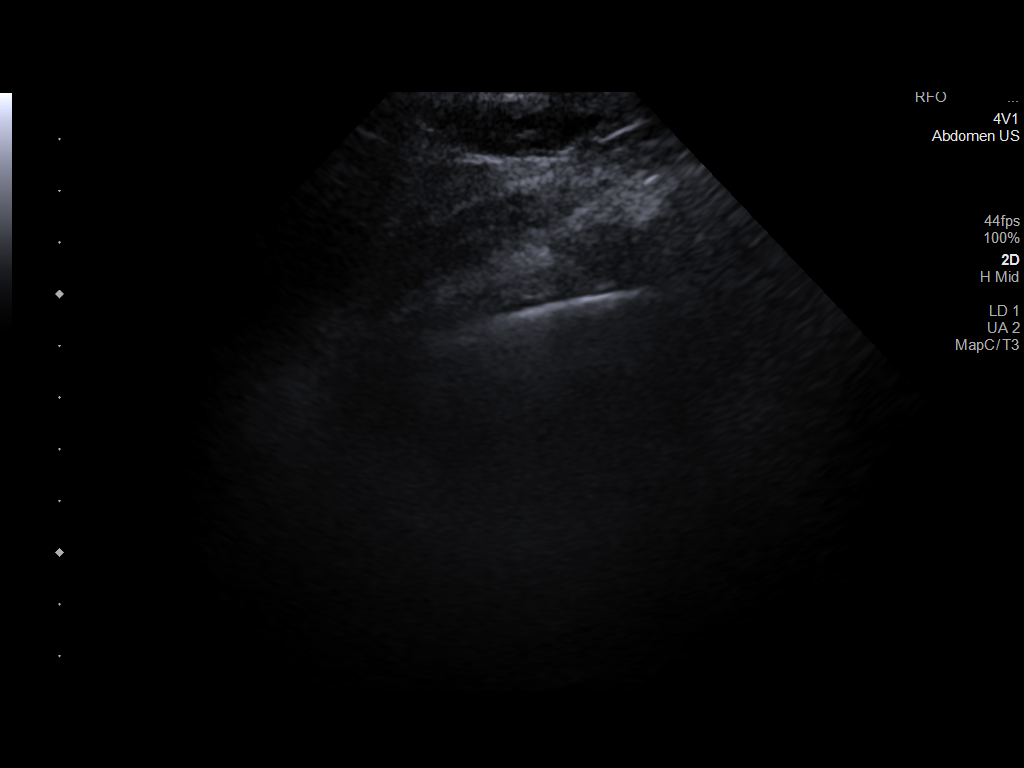

[3 of 3 positions shown; findings below may reference images not displayed]

FINDINGS: Small to moderate RIGHT pleural effusion identified.
IMPRESSION: Small to moderate RIGHT pleural effusion.

Patient being transferred to [HOSPITAL] for cardiac
catheterization; thoracentesis not performed.

## 2023-07-04 DIAGNOSIS — Z23 Encounter for immunization: Secondary | ICD-10-CM | POA: Diagnosis not present

## 2023-07-04 DIAGNOSIS — I5082 Biventricular heart failure: Secondary | ICD-10-CM | POA: Diagnosis not present

## 2023-07-04 DIAGNOSIS — Z6841 Body Mass Index (BMI) 40.0 and over, adult: Secondary | ICD-10-CM | POA: Diagnosis not present

## 2023-07-04 DIAGNOSIS — Z0000 Encounter for general adult medical examination without abnormal findings: Secondary | ICD-10-CM | POA: Diagnosis not present

## 2023-07-04 DIAGNOSIS — B3322 Viral myocarditis: Secondary | ICD-10-CM | POA: Diagnosis not present

## 2023-07-04 DIAGNOSIS — R768 Other specified abnormal immunological findings in serum: Secondary | ICD-10-CM | POA: Diagnosis not present

## 2023-07-04 DIAGNOSIS — Z1331 Encounter for screening for depression: Secondary | ICD-10-CM | POA: Diagnosis not present

## 2024-06-22 ENCOUNTER — Other Ambulatory Visit (HOSPITAL_COMMUNITY): Payer: Self-pay | Admitting: Internal Medicine

## 2024-08-02 ENCOUNTER — Other Ambulatory Visit (HOSPITAL_COMMUNITY): Payer: Self-pay | Admitting: Internal Medicine

## 2024-10-15 ENCOUNTER — Other Ambulatory Visit (HOSPITAL_COMMUNITY): Payer: Self-pay | Admitting: Internal Medicine
